# Patient Record
Sex: Female | Born: 1940 | Race: Black or African American | Hispanic: No | Marital: Single | Smoking: Former smoker
Health system: Southern US, Community
[De-identification: ages and names within clinical notes are randomized; demographics above are authoritative.]

## PROBLEM LIST (undated history)

## (undated) DIAGNOSIS — E785 Hyperlipidemia, unspecified: Secondary | ICD-10-CM

## (undated) DIAGNOSIS — K635 Polyp of colon: Secondary | ICD-10-CM

## (undated) DIAGNOSIS — K5790 Diverticulosis of intestine, part unspecified, without perforation or abscess without bleeding: Secondary | ICD-10-CM

## (undated) DIAGNOSIS — I10 Essential (primary) hypertension: Secondary | ICD-10-CM

## (undated) DIAGNOSIS — J8410 Pulmonary fibrosis, unspecified: Secondary | ICD-10-CM

## (undated) DIAGNOSIS — K219 Gastro-esophageal reflux disease without esophagitis: Secondary | ICD-10-CM

## (undated) DIAGNOSIS — B1920 Unspecified viral hepatitis C without hepatic coma: Secondary | ICD-10-CM

## (undated) HISTORY — DX: Gastro-esophageal reflux disease without esophagitis: K21.9

## (undated) HISTORY — PX: BREAST BIOPSY: SHX20

## (undated) HISTORY — DX: Diverticulosis of intestine, part unspecified, without perforation or abscess without bleeding: K57.90

## (undated) HISTORY — DX: Polyp of colon: K63.5

## (undated) HISTORY — PX: OTHER SURGICAL HISTORY: SHX169

## (undated) HISTORY — DX: Unspecified viral hepatitis C without hepatic coma: B19.20

## (undated) HISTORY — PX: ROTATOR CUFF REPAIR: SHX139

## (undated) HISTORY — PX: HEMICOLECTOMY: SHX854

## (undated) HISTORY — DX: Hyperlipidemia, unspecified: E78.5

## (undated) HISTORY — PX: ABDOMINAL HYSTERECTOMY: SHX81

## (undated) HISTORY — DX: Essential (primary) hypertension: I10

---

## 2001-02-22 ENCOUNTER — Encounter: Admission: RE | Admit: 2001-02-22 | Discharge: 2001-02-22 | Payer: Self-pay | Admitting: Family Medicine

## 2001-02-22 ENCOUNTER — Encounter: Payer: Self-pay | Admitting: Family Medicine

## 2001-03-14 ENCOUNTER — Encounter: Admission: RE | Admit: 2001-03-14 | Discharge: 2001-06-12 | Payer: Self-pay | Admitting: Family Medicine

## 2001-04-25 ENCOUNTER — Ambulatory Visit (HOSPITAL_COMMUNITY): Admission: RE | Admit: 2001-04-25 | Discharge: 2001-04-25 | Payer: Self-pay | Admitting: Gastroenterology

## 2001-05-31 ENCOUNTER — Emergency Department (HOSPITAL_COMMUNITY): Admission: EM | Admit: 2001-05-31 | Discharge: 2001-05-31 | Payer: Self-pay | Admitting: Emergency Medicine

## 2001-05-31 ENCOUNTER — Encounter: Payer: Self-pay | Admitting: Emergency Medicine

## 2001-10-04 ENCOUNTER — Encounter: Payer: Self-pay | Admitting: Family Medicine

## 2001-10-04 ENCOUNTER — Encounter: Admission: RE | Admit: 2001-10-04 | Discharge: 2001-10-04 | Payer: Self-pay | Admitting: Family Medicine

## 2002-02-26 ENCOUNTER — Encounter: Admission: RE | Admit: 2002-02-26 | Discharge: 2002-02-26 | Payer: Self-pay | Admitting: Family Medicine

## 2002-02-26 ENCOUNTER — Encounter: Payer: Self-pay | Admitting: Family Medicine

## 2002-08-09 ENCOUNTER — Encounter: Payer: Self-pay | Admitting: Family Medicine

## 2002-08-09 ENCOUNTER — Encounter: Admission: RE | Admit: 2002-08-09 | Discharge: 2002-08-09 | Payer: Self-pay | Admitting: Family Medicine

## 2002-09-05 HISTORY — PX: CHOLECYSTECTOMY: SHX55

## 2002-09-20 ENCOUNTER — Encounter: Admission: RE | Admit: 2002-09-20 | Discharge: 2002-09-20 | Payer: Self-pay | Admitting: Family Medicine

## 2002-09-20 ENCOUNTER — Encounter: Payer: Self-pay | Admitting: Family Medicine

## 2003-02-28 ENCOUNTER — Encounter: Admission: RE | Admit: 2003-02-28 | Discharge: 2003-02-28 | Payer: Self-pay | Admitting: Family Medicine

## 2003-02-28 ENCOUNTER — Encounter: Payer: Self-pay | Admitting: Family Medicine

## 2003-04-03 ENCOUNTER — Encounter: Payer: Self-pay | Admitting: Family Medicine

## 2003-04-03 ENCOUNTER — Encounter: Admission: RE | Admit: 2003-04-03 | Discharge: 2003-04-03 | Payer: Self-pay | Admitting: Family Medicine

## 2003-05-01 ENCOUNTER — Ambulatory Visit (HOSPITAL_COMMUNITY): Admission: RE | Admit: 2003-05-01 | Discharge: 2003-05-01 | Payer: Self-pay | Admitting: Family Medicine

## 2003-05-01 ENCOUNTER — Encounter: Payer: Self-pay | Admitting: Family Medicine

## 2003-06-04 ENCOUNTER — Ambulatory Visit (HOSPITAL_COMMUNITY): Admission: RE | Admit: 2003-06-04 | Discharge: 2003-06-04 | Payer: Self-pay | Admitting: Gastroenterology

## 2003-06-09 ENCOUNTER — Encounter: Payer: Self-pay | Admitting: Surgery

## 2003-06-13 ENCOUNTER — Encounter (INDEPENDENT_AMBULATORY_CARE_PROVIDER_SITE_OTHER): Payer: Self-pay | Admitting: Specialist

## 2003-06-14 ENCOUNTER — Encounter: Payer: Self-pay | Admitting: Surgery

## 2003-06-14 ENCOUNTER — Inpatient Hospital Stay (HOSPITAL_COMMUNITY): Admission: RE | Admit: 2003-06-14 | Discharge: 2003-06-16 | Payer: Self-pay | Admitting: Surgery

## 2003-12-05 ENCOUNTER — Encounter: Admission: RE | Admit: 2003-12-05 | Discharge: 2003-12-05 | Payer: Self-pay | Admitting: Family Medicine

## 2004-10-25 ENCOUNTER — Encounter: Admission: RE | Admit: 2004-10-25 | Discharge: 2004-10-25 | Payer: Self-pay | Admitting: Family Medicine

## 2005-03-31 ENCOUNTER — Ambulatory Visit (HOSPITAL_BASED_OUTPATIENT_CLINIC_OR_DEPARTMENT_OTHER): Admission: RE | Admit: 2005-03-31 | Discharge: 2005-03-31 | Payer: Self-pay | Admitting: General Surgery

## 2005-03-31 ENCOUNTER — Ambulatory Visit (HOSPITAL_COMMUNITY): Admission: RE | Admit: 2005-03-31 | Discharge: 2005-03-31 | Payer: Self-pay | Admitting: General Surgery

## 2005-03-31 ENCOUNTER — Encounter (INDEPENDENT_AMBULATORY_CARE_PROVIDER_SITE_OTHER): Payer: Self-pay | Admitting: *Deleted

## 2005-04-09 ENCOUNTER — Emergency Department (HOSPITAL_COMMUNITY): Admission: EM | Admit: 2005-04-09 | Discharge: 2005-04-10 | Payer: Self-pay | Admitting: Emergency Medicine

## 2005-07-19 ENCOUNTER — Encounter: Admission: RE | Admit: 2005-07-19 | Discharge: 2005-07-19 | Payer: Self-pay | Admitting: Family Medicine

## 2005-10-10 ENCOUNTER — Emergency Department (HOSPITAL_COMMUNITY): Admission: EM | Admit: 2005-10-10 | Discharge: 2005-10-10 | Payer: Self-pay | Admitting: Emergency Medicine

## 2005-10-31 ENCOUNTER — Encounter: Admission: RE | Admit: 2005-10-31 | Discharge: 2005-10-31 | Payer: Self-pay | Admitting: Family Medicine

## 2005-11-07 ENCOUNTER — Encounter: Admission: RE | Admit: 2005-11-07 | Discharge: 2005-11-07 | Payer: Self-pay | Admitting: Family Medicine

## 2006-02-03 ENCOUNTER — Ambulatory Visit: Payer: Self-pay | Admitting: Family Medicine

## 2006-03-20 ENCOUNTER — Ambulatory Visit: Payer: Self-pay | Admitting: Family Medicine

## 2006-09-14 ENCOUNTER — Ambulatory Visit: Payer: Self-pay | Admitting: Family Medicine

## 2006-10-16 ENCOUNTER — Ambulatory Visit: Payer: Self-pay | Admitting: Family Medicine

## 2006-10-17 ENCOUNTER — Encounter: Admission: RE | Admit: 2006-10-17 | Discharge: 2006-10-17 | Payer: Self-pay | Admitting: Physician Assistant

## 2006-10-22 ENCOUNTER — Emergency Department (HOSPITAL_COMMUNITY): Admission: EM | Admit: 2006-10-22 | Discharge: 2006-10-22 | Payer: Self-pay | Admitting: Emergency Medicine

## 2006-10-31 ENCOUNTER — Ambulatory Visit: Payer: Self-pay | Admitting: Family Medicine

## 2006-11-15 ENCOUNTER — Ambulatory Visit: Payer: Self-pay | Admitting: Family Medicine

## 2006-11-24 ENCOUNTER — Encounter: Admission: RE | Admit: 2006-11-24 | Discharge: 2006-11-24 | Payer: Self-pay | Admitting: Internal Medicine

## 2007-01-04 ENCOUNTER — Ambulatory Visit: Payer: Self-pay | Admitting: Family Medicine

## 2007-03-16 ENCOUNTER — Emergency Department (HOSPITAL_COMMUNITY): Admission: EM | Admit: 2007-03-16 | Discharge: 2007-03-16 | Payer: Self-pay | Admitting: Emergency Medicine

## 2007-05-03 ENCOUNTER — Ambulatory Visit: Payer: Self-pay | Admitting: Family Medicine

## 2007-07-18 ENCOUNTER — Ambulatory Visit: Payer: Self-pay | Admitting: Family Medicine

## 2007-07-19 ENCOUNTER — Encounter: Admission: RE | Admit: 2007-07-19 | Discharge: 2007-07-19 | Payer: Self-pay | Admitting: Family Medicine

## 2007-10-02 ENCOUNTER — Ambulatory Visit: Payer: Self-pay | Admitting: Family Medicine

## 2007-11-26 ENCOUNTER — Encounter: Admission: RE | Admit: 2007-11-26 | Discharge: 2007-11-26 | Payer: Self-pay | Admitting: Internal Medicine

## 2007-11-28 ENCOUNTER — Ambulatory Visit: Payer: Self-pay | Admitting: Family Medicine

## 2007-12-05 ENCOUNTER — Encounter: Admission: RE | Admit: 2007-12-05 | Discharge: 2007-12-05 | Payer: Self-pay | Admitting: Family Medicine

## 2008-02-11 ENCOUNTER — Ambulatory Visit: Payer: Self-pay | Admitting: Family Medicine

## 2008-06-25 ENCOUNTER — Ambulatory Visit: Payer: Self-pay | Admitting: Family Medicine

## 2008-07-14 ENCOUNTER — Ambulatory Visit: Payer: Self-pay | Admitting: Family Medicine

## 2008-10-06 ENCOUNTER — Ambulatory Visit: Payer: Self-pay | Admitting: Family Medicine

## 2008-12-01 ENCOUNTER — Encounter: Admission: RE | Admit: 2008-12-01 | Discharge: 2008-12-01 | Payer: Self-pay | Admitting: Internal Medicine

## 2008-12-22 ENCOUNTER — Ambulatory Visit: Payer: Self-pay | Admitting: Family Medicine

## 2008-12-23 ENCOUNTER — Encounter: Admission: RE | Admit: 2008-12-23 | Discharge: 2008-12-23 | Payer: Self-pay | Admitting: Family Medicine

## 2009-01-12 ENCOUNTER — Ambulatory Visit: Payer: Self-pay | Admitting: Family Medicine

## 2009-01-13 ENCOUNTER — Encounter: Admission: RE | Admit: 2009-01-13 | Discharge: 2009-01-13 | Payer: Self-pay | Admitting: Family Medicine

## 2009-04-13 ENCOUNTER — Ambulatory Visit: Payer: Self-pay | Admitting: Family Medicine

## 2009-06-11 ENCOUNTER — Emergency Department (HOSPITAL_COMMUNITY): Admission: EM | Admit: 2009-06-11 | Discharge: 2009-06-11 | Payer: Self-pay | Admitting: Emergency Medicine

## 2009-09-16 ENCOUNTER — Ambulatory Visit: Payer: Self-pay | Admitting: Family Medicine

## 2009-12-02 ENCOUNTER — Encounter: Admission: RE | Admit: 2009-12-02 | Discharge: 2009-12-02 | Payer: Self-pay | Admitting: Family Medicine

## 2009-12-02 LAB — HM MAMMOGRAPHY

## 2009-12-21 ENCOUNTER — Ambulatory Visit: Payer: Self-pay | Admitting: Physician Assistant

## 2010-04-30 ENCOUNTER — Ambulatory Visit: Payer: Self-pay | Admitting: Family Medicine

## 2010-07-19 ENCOUNTER — Ambulatory Visit: Payer: Self-pay | Admitting: Family Medicine

## 2010-07-19 ENCOUNTER — Encounter: Admission: RE | Admit: 2010-07-19 | Discharge: 2010-07-19 | Payer: Self-pay | Admitting: Family Medicine

## 2010-07-20 ENCOUNTER — Encounter: Admission: RE | Admit: 2010-07-20 | Discharge: 2010-07-20 | Payer: Self-pay | Admitting: Family Medicine

## 2010-09-01 ENCOUNTER — Ambulatory Visit: Payer: Self-pay | Admitting: Family Medicine

## 2010-09-13 ENCOUNTER — Ambulatory Visit
Admission: RE | Admit: 2010-09-13 | Discharge: 2010-09-13 | Payer: Self-pay | Source: Home / Self Care | Attending: Family Medicine | Admitting: Family Medicine

## 2010-09-14 ENCOUNTER — Encounter
Admission: RE | Admit: 2010-09-14 | Discharge: 2010-09-14 | Payer: Self-pay | Source: Home / Self Care | Attending: Family Medicine | Admitting: Family Medicine

## 2010-09-17 ENCOUNTER — Encounter
Admission: RE | Admit: 2010-09-17 | Discharge: 2010-09-17 | Payer: Self-pay | Source: Home / Self Care | Attending: Family Medicine | Admitting: Family Medicine

## 2010-09-23 ENCOUNTER — Encounter
Admission: RE | Admit: 2010-09-23 | Discharge: 2010-09-23 | Payer: Self-pay | Source: Home / Self Care | Attending: Orthopaedic Surgery | Admitting: Orthopaedic Surgery

## 2010-11-01 ENCOUNTER — Other Ambulatory Visit: Payer: Self-pay | Admitting: Psychiatry

## 2010-11-01 DIAGNOSIS — Z1231 Encounter for screening mammogram for malignant neoplasm of breast: Secondary | ICD-10-CM

## 2010-11-15 ENCOUNTER — Ambulatory Visit (INDEPENDENT_AMBULATORY_CARE_PROVIDER_SITE_OTHER): Payer: Medicare Other | Admitting: Family Medicine

## 2010-11-15 DIAGNOSIS — J029 Acute pharyngitis, unspecified: Secondary | ICD-10-CM

## 2010-11-15 DIAGNOSIS — J069 Acute upper respiratory infection, unspecified: Secondary | ICD-10-CM

## 2010-11-24 ENCOUNTER — Ambulatory Visit (INDEPENDENT_AMBULATORY_CARE_PROVIDER_SITE_OTHER): Payer: Medicare Other

## 2010-11-24 DIAGNOSIS — I10 Essential (primary) hypertension: Secondary | ICD-10-CM

## 2010-11-24 DIAGNOSIS — E7800 Pure hypercholesterolemia, unspecified: Secondary | ICD-10-CM

## 2010-11-24 DIAGNOSIS — E119 Type 2 diabetes mellitus without complications: Secondary | ICD-10-CM

## 2010-11-24 DIAGNOSIS — Z79899 Other long term (current) drug therapy: Secondary | ICD-10-CM

## 2010-11-29 ENCOUNTER — Ambulatory Visit (INDEPENDENT_AMBULATORY_CARE_PROVIDER_SITE_OTHER): Payer: Medicare Other | Admitting: Family Medicine

## 2010-11-29 DIAGNOSIS — E7800 Pure hypercholesterolemia, unspecified: Secondary | ICD-10-CM

## 2010-11-29 DIAGNOSIS — E119 Type 2 diabetes mellitus without complications: Secondary | ICD-10-CM

## 2010-11-29 DIAGNOSIS — I10 Essential (primary) hypertension: Secondary | ICD-10-CM

## 2010-12-06 ENCOUNTER — Ambulatory Visit
Admission: RE | Admit: 2010-12-06 | Discharge: 2010-12-06 | Disposition: A | Discharge status: Home / Self Care | Payer: Federal, State, Local not specified - PPO | Source: Ambulatory Visit | Attending: Psychiatry | Admitting: Psychiatry

## 2010-12-06 DIAGNOSIS — Z1231 Encounter for screening mammogram for malignant neoplasm of breast: Secondary | ICD-10-CM

## 2010-12-09 LAB — COMPREHENSIVE METABOLIC PANEL
ALT: 35 U/L (ref 0–35)
AST: 43 U/L — ABNORMAL HIGH (ref 0–37)
Creatinine, Ser: 0.88 mg/dL (ref 0.4–1.2)
GFR calc non Af Amer: >60 mL/min (ref >60)
Potassium: 3.7 mEq/L (ref 3.5–5.1)
Sodium: 140 mEq/L (ref 135–145)
Total Bilirubin: 0.7 mg/dL (ref 0.3–1.2)

## 2010-12-09 LAB — CBC
Hemoglobin: 12.2 g/dL (ref 12.0–15.0)
MCHC: 34.2 g/dL (ref 30.0–36.0)
Platelets: 170 10*3/uL (ref 150–400)
RDW: 13.7 % (ref 11.5–15.5)

## 2010-12-09 LAB — POCT CARDIAC MARKERS: Troponin i, poc: <0.05 ng/mL (ref 0.00–0.09)

## 2010-12-09 LAB — DIFFERENTIAL
Basophils Absolute: 0 10*3/uL (ref 0.0–0.1)
Basophils Relative: 0 % (ref 0–1)
Eosinophils Absolute: 0.2 10*3/uL (ref 0.0–0.7)
Eosinophils Relative: 4 % (ref 0–5)
Lymphocytes Relative: 32 % (ref 12–46)

## 2010-12-09 LAB — BRAIN NATRIURETIC PEPTIDE: Pro B Natriuretic peptide (BNP): <30 pg/mL (ref 0.0–100.0)

## 2011-01-21 NOTE — Op Note (Signed)
NAMEJEARLINE, Margaret Adkins NO.:  [id number]   MEDICAL RECORD NO.:  [id number]          PATIENT TYPE:  AMB   LOCATION:  DSC                          FACILITY:  [facility]   PHYSICIAN:  Gabrielle Dare. Janee Morn, M.D.DATE OF BIRTH:  01-08-1941   DATE OF PROCEDURE:  03/31/2005  DATE OF DISCHARGE:                                 OPERATIVE REPORT   PREOPERATIVE DIAGNOSIS:  Soft tissue mass, left back.   POSTOPERATIVE DIAGNOSIS:  Same.   PROCEDURE PERFORMED:  Excision of soft tissue mass of left back.   SURGEON:  Gabrielle Dare. Janee Morn, M.D.   ANESTHESIA:  MAC.   HISTORY OF PRESENT ILLNESS:  The patient is a 70 year old African-American  female whom I evaluated in the office for a soft tissue mass on her left  back.  She has had multiple lipomas removed from her back on both sides in  the lateral area.  She developed another one that formed between two of her  previous biopsy sites and has become larger and more tender.  She has some  localized discomfort there.  She presents for elective excision.   DESCRIPTION OF PROCEDURE:  The patient was identified.  The mass to be  removed was marked in preop holding.  She received intravenous antibiotics.  She was brought to the operating room.  MAC anesthesia was administered.  She was placed in the prone position.  Her left back was prepped and draped  in sterile fashion.   0.25% marcaine with epi was injected for local anesthetic.  A transverse  incision was made over the palpable mass.  The subcutaneous tissues were  dissected down, and the mass was retracted and circumferentially dissected.  It was somewhat adherent deeply to the musculature, and some fascia was  removed with it.  Some further local was injected.  The dissection continued  circumferentially, and the entire mass of lipomatous tissue was removed.  After removal of the first larger piece, palpation revealed a smaller  extension or second lesion laterally.  This was also  excised in a similar  fashion.  The wound was copiously irrigated.   Meticulous hemostasis was obtained in the wound.  Subcutaneous tissues were  approximated with interrupted 3-0 Vicryl sutures, and the skin was closed  with running 4-0 Monocryl subcuticular stitch.  Sponge, needle, and  instrument counts were correct.  Benzoin, Steri-Strips, and sterile dressing  were applied.  The patient tolerated the procedure well without apparent  complication and was taken to the recovery room in stable condition.       BET/MEDQ  D:  03/31/2005  T:  03/31/2005  Job:  [id number]

## 2011-01-21 NOTE — Op Note (Signed)
NAMEDARA, BEIDLEMAN NO.:  [id number]   MEDICAL RECORD NO.:  [id number]                   PATIENT TYPE:  OBV   LOCATION:  0464                                 FACILITY:  [facility]   PHYSICIAN:  Abigail Miyamoto, M.D.              DATE OF BIRTH:  01/09/1941   DATE OF PROCEDURE:  06/13/2003  DATE OF DISCHARGE:                                 OPERATIVE REPORT   PREOPERATIVE DIAGNOSES:  1. Biliary dyskinesia.  2. A 1 cm mass, right foot.   POSTOPERATIVE DIAGNOSES:  1. Biliary dyskinesia.  2. A 1 cm mass, right foot.   OPERATION/PROCEDURE:  1. Laparoscopic cholecystectomy.  2. Excision of 1 cm mass, right foot.   SURGEON:  Abigail Miyamoto, M.D.   ANESTHESIA:  General endotracheal anesthesia.   ESTIMATED BLOOD LOSS:  Minimal.   DESCRIPTION OF PROCEDURE:  The patient was brought to the operating room,  identified as Margaret Adkins.  She was placed supine on the operating room  table and general anesthesia was induced.  Her abdomen was then prepped and  draped in the usual sterile fashion.  Using #15 blade, a small transverse  incision was made below the umbilicus.  The incision was carried down to the  fascia which was then opened with a scalpel.  Hemostat was then used to pass  through the peritoneal cavity.  Next, a 0 Vicryl pursestring was placed  around the fascial opening.  The Hasson port was placed through the opening  and insufflation of the abdomen was begun.  Next, a 12 mm port was placed in  the patient's epigastrium and two 5 mm ports were placed in the patient's  right flank under direct vision.  The patient had multiple adhesions to the  gallbladder and the gallbladder appeared chronically inflamed.  Adhesions  were taken down from the gallbladder bluntly and with the electrocautery.  The cystic duct was then identified as well as the cystic artery.  The duct  was clipped once distally and partly transfixed with scissors.  It,  however,  was quite friable.  The decision was made to forego a cholangiogram for risk  of tearing the duct further.  The duct, therefore, was clipped three times  proximally and completely transected.  Cystic artery was clipped proximally  and once distally and transected as well.  The gallbladder was then slowly  dissected free from the liver bed with the electrocautery.  Once the  gallbladder was free from the liver bed,  it was placed in an endosac and  removed through the incision at the umbilicus. The liver bed was examined  and hemostasis was felt to be achieved.  The abdomen was then irrigated with  normal saline.  The 0 Vicryl was tied in place closing the fascial defect at  the umbilicus.  All ports were removed under direct vision and the abdomen  was deflated.  All incisions  were then anesthetized with 0.25% Marcaine and  then closed with 4-0 Monocryl subcuticular sutures.  Steri-Strips, gauze and  tape were then applied.   The patient's right foot was then prepped and draped in the usual sterile  fashion.  The patient was found to have a small 1 cm mass on the plantar  aspect of the foot near the arch medially.  A small longitudinal incision  was made over this and it was taken down to the mass which was completely  excised.  It had the appearance of scar tissue or a ganglion.  It was sent  to pathology for identification.  The wound was then closed with two  separate 2-0 nylon sutures.  Gauze and tape were then applied.   The patient tolerated the procedure well.  All sponge, needle and instrument  counts were correct at the end of the procedure.  The patient was then  extubated in the operating room and taken in stable condition to the  recovery room.                                                 Abigail Miyamoto, M.D.    DB/MEDQ  D:  06/13/2003  T:  06/13/2003  Job:  [id number]

## 2011-01-21 NOTE — Op Note (Signed)
   NAME:  Margaret Adkins, Margaret Adkins                         ACCOUNT NO.:  [id number]   MEDICAL RECORD NO.:  [id number]                   PATIENT TYPE:  AMB   LOCATION:  ENDO                                 FACILITY:  [facility]   PHYSICIAN:  Anselmo Rod, M.D.               DATE OF BIRTH:  23-Aug-1941   DATE OF PROCEDURE:  06/05/2003  DATE OF DISCHARGE:                                 OPERATIVE REPORT   PROCEDURE PERFORMED:  Esophagogastroduodenoscopy.   ENDOSCOPIST:  Charna Elizabeth, M.D.   INSTRUMENT USED:  Olympus video panendoscope.   INDICATIONS FOR PROCEDURE:  Epigastric pain and history of dysfunctional  gallbladder in a 70 year old [place] American female with a history of  hepatitis C.  Rule out peptic ulcer disease, esophagitis, gastritis, etc.   PREPROCEDURE PREPARATION:  Informed consent was procured from the patient.  The patient was fasted for eight hours prior to the procedure.   PREPROCEDURE PHYSICAL EXAMINATION:  VITAL SIGNS:  The patient has stable  vital signs.  NECK:  Supple.  CHEST:  Clear to auscultation.  HEART:  S1 and S2 regular.  ABDOMEN:  Soft with normal bowel sounds.   DESCRIPTION OF PROCEDURE:  The patient was placed in the left lateral  decubitus position and sedated with 80 mg of Demerol and 9 mg of Versed  intravenously.  Once the patient was adequately sedated and maintained on  low flow oxygen and continuous cardiac monitoring, the Olympus video  panendoscope was advanced through the mouthpiece, over the tongue, into the  esophagus under direct vision.  The entire esophagus appeared normal with no  evidence of ring, stricture, masses, esophagitis or Barrett's mucosa.  The  scope was then advanced to the stomach.  A small hiatal hernia was seen on  high retroflexion.  The rest of the gastric mucosa and proximal small bowel  appeared normal.   IMPRESSION:  Normal esophagogastroduodenoscopy except for small hiatal  hernia.    RECOMMENDATIONS:  1. Avoid  nonsterioidals.  2. Follow antireflux measures.  3. Proceed with a laparoscopic cholecystectomy as discussed with Dr.     Magnus Ivan over the phone.                                               Anselmo Rod, M.D.    JNM/MEDQ  D:  06/05/2003  T:  06/05/2003  Job:  [id number]   cc:   Talmadge Coventry, M.D.  [address], [address]  [place]  [place] [address]  Fax: [phone]   Abigail Miyamoto, M.D.  [address]  [place]  [place] [address]  Fax: [phone]

## 2011-01-28 ENCOUNTER — Telehealth (INDEPENDENT_AMBULATORY_CARE_PROVIDER_SITE_OTHER): Payer: Federal, State, Local not specified - PPO | Admitting: Family Medicine

## 2011-01-28 DIAGNOSIS — I10 Essential (primary) hypertension: Secondary | ICD-10-CM

## 2011-01-28 MED ORDER — OLMESARTAN MEDOXOMIL-HCTZ 40-25 MG PO TABS: 1.0000 | ORAL_TABLET | Freq: Every day | ORAL | Status: DC

## 2011-02-03 ENCOUNTER — Ambulatory Visit (INDEPENDENT_AMBULATORY_CARE_PROVIDER_SITE_OTHER): Payer: Medicare Other | Admitting: Medical

## 2011-02-03 ENCOUNTER — Encounter: Payer: Self-pay | Admitting: Medical

## 2011-02-03 VITALS — BP 130/72 | HR 64 | Temp 97.6°F | Ht 63.0 in | Wt 162.0 lb

## 2011-02-03 DIAGNOSIS — M25511 Pain in right shoulder: Secondary | ICD-10-CM

## 2011-02-03 DIAGNOSIS — M25519 Pain in unspecified shoulder: Secondary | ICD-10-CM

## 2011-02-03 DIAGNOSIS — M542 Cervicalgia: Secondary | ICD-10-CM

## 2011-02-03 MED ORDER — CYCLOBENZAPRINE HCL 5 MG PO TABS: 5.0000 mg | ORAL_TABLET | Freq: Every evening | ORAL | Status: AC | PRN

## 2011-02-03 MED ORDER — TRAMADOL HCL 50 MG PO TABS: 50.0000 mg | ORAL_TABLET | Freq: Four times a day (QID) | ORAL | Status: DC | PRN

## 2011-02-03 NOTE — Progress Notes (Signed)
Subjective:   HPI  Margaret Adkins is a 70 y.o. female who presents for pain in her right shoulder region for 2- 3 months.  She denies injury or trauma.  The area just starting aching out of the blue and is continued to be a problem since November of 2011.  She notes that she has been seen multiple times by [place] and orthopedic for this problem, had an injection into her right [place] joint which helped a little for a short period of time. But now she continues to complain of pain, decreased range of motion of her right shoulder, neck pain, swelling at the [facility] joint on the right, and she feels as if the [facility] joint area and is growing or getting bigger. She uses 2 Aleve twice a day, no other aggravating or relieving factors.  No other c/o.  The following portions of the patient's history were reviewed and updated as appropriate: allergies, current medications, past family history, past medical history, past social history, past surgical history and problem list.   Review of Systems Constitutional: denies fever, chills, sweats, unexpected weight change, anorexia, fatigue Dermatology: denies rash ENT: no runny nose, ear pain, sore throat, hoarseness, sinus pain Cardiology: denies chest pain, palpitations, edema Respiratory: Occasional cough; denies shortness of breath, wheezing Gastroenterology: denies abdominal pain, nausea, vomiting, diarrhea, constipation Musculoskeletal: denies arthralgias, myalgias, joint swelling, back pain Neurology: no headache, weakness, tingling, numbness     Objective:   Physical Exam  General appearance: alert, no distress, WD/WN, African American female Neck: Tender along right neck in general, normal range of motion, no lymphadenopathy, no thyromegaly, no masses Heart: RRR, normal S1, S2, no murmurs Lungs: CTA bilaterally, no wheezes, rhonchi, or rales Back: Tender along right upper back in general, otherwise nontender normal range of motion  Musculoskeletal: Tender along  her right [place] joint with some generalized swelling of this area, the [place] joint with bony arthritic changes certainly asymmetrical compared to the left, tender along the right clavicle, tender along the right a.c. joint, pain with passive and active shoulder range of motion beyond 70 with flexion, abduction and adduction, otherwise, the rest of the right arm exam unremarkable, left arm unremarkable Extremities: no edema, no cyanosis, no clubbing Pulses: 2+ symmetric, upper and lower extremities Neurological: Bilateral upper extremity arms with normal strength and sensation Psychiatric: normal affect, behavior normal, pleasant    Assessment :    Encounter Diagnoses  Name Primary?  . Sternoclavicular joint pain Yes  . Neck pain   . Shoulder pain, right      Plan:    I reviewed her prior office notes and imaging including MRI chest from 09/23/10 showing right sternoclavicular arthropathy with underlying degenerative findings and edema in the medial clavicle, and adjacent sternum, and sternoclavicular joint. MRI C-spine: 09/17/10 with cervical spondylosis most prominent at C6-7, foraminal encroachment by osteophytes at C5-6 and C6-7 without suspicion of compressive stenosis. I reviewed Dr. Eliberto Ivory orthopedic note from 10/04/10 with an assessment of right-sided severe sternoclavicular arthritis, and at that time she was to continue anti-inflammatories. At some point she ended up having a steroid injection of that area.  At this point I prescribed her something for pain and a muscle relaxer, and she can continue to use Aleve twice a day for now. She will call Dr. Magnus Ivan for a recheck appointment on this issue. She needs further evaluation and management per orthopedic.

## 2011-02-03 NOTE — Patient Instructions (Signed)
She will followup Dr. Magnus Ivan, orthopedic.

## 2011-02-21 NOTE — Telephone Encounter (Signed)
DONE

## 2011-03-18 ENCOUNTER — Other Ambulatory Visit: Payer: Self-pay | Admitting: Family Medicine

## 2011-03-18 NOTE — Telephone Encounter (Signed)
CALLED PT TO LET HER NO MED SAMPLES WERE READY

## 2011-03-18 NOTE — Telephone Encounter (Signed)
Pt would like samples of actoplusmet 15/500 and Benicar 40/25

## 2011-03-18 NOTE — Telephone Encounter (Signed)
Called left message samples up front

## 2011-03-18 NOTE — Telephone Encounter (Signed)
If we have them, she can have a few.

## 2011-06-21 LAB — I-STAT 8, (EC8 V) (CONVERTED LAB)
BUN: 20
Chloride: 104
HCT: 41
Potassium: 4.1
pCO2, Ven: 44.7 — ABNORMAL LOW
pH, Ven: 7.396 — ABNORMAL HIGH

## 2011-06-21 LAB — CBC
HCT: 36.3
Hemoglobin: 12
RBC: 3.99
WBC: 7.4

## 2011-06-21 LAB — POCT I-STAT CREATININE
Creatinine, Ser: 0.9
Operator id: 198171

## 2011-06-21 LAB — DIFFERENTIAL
Eosinophils Relative: 1
Lymphs Abs: 2.8
Monocytes Relative: 7
Neutro Abs: 3.9

## 2011-09-19 ENCOUNTER — Ambulatory Visit
Admission: RE | Admit: 2011-09-19 | Discharge: 2011-09-19 | Disposition: A | Discharge status: Home / Self Care | Payer: Medicare Other | Source: Ambulatory Visit | Attending: Family Medicine | Admitting: Family Medicine

## 2011-09-19 ENCOUNTER — Encounter: Payer: Self-pay | Admitting: Family Medicine

## 2011-09-19 ENCOUNTER — Encounter: Payer: Self-pay | Admitting: *Deleted

## 2011-09-19 ENCOUNTER — Ambulatory Visit (INDEPENDENT_AMBULATORY_CARE_PROVIDER_SITE_OTHER): Payer: Medicare Other | Admitting: Family Medicine

## 2011-09-19 DIAGNOSIS — R52 Pain, unspecified: Secondary | ICD-10-CM

## 2011-09-19 DIAGNOSIS — J209 Acute bronchitis, unspecified: Secondary | ICD-10-CM

## 2011-09-19 DIAGNOSIS — E119 Type 2 diabetes mellitus without complications: Secondary | ICD-10-CM

## 2011-09-19 DIAGNOSIS — I10 Essential (primary) hypertension: Secondary | ICD-10-CM | POA: Insufficient documentation

## 2011-09-19 DIAGNOSIS — E785 Hyperlipidemia, unspecified: Secondary | ICD-10-CM | POA: Insufficient documentation

## 2011-09-19 DIAGNOSIS — R05 Cough: Secondary | ICD-10-CM

## 2011-09-19 DIAGNOSIS — R079 Chest pain, unspecified: Secondary | ICD-10-CM

## 2011-09-19 DIAGNOSIS — E7800 Pure hypercholesterolemia, unspecified: Secondary | ICD-10-CM

## 2011-09-19 LAB — POCT INFLUENZA A/B: Influenza B, POC: NEGATIVE

## 2011-09-19 MED ORDER — AZITHROMYCIN 250 MG PO TABS: ORAL_TABLET | ORAL | Status: AC

## 2011-09-19 MED ORDER — ALBUTEROL SULFATE HFA 108 (90 BASE) MCG/ACT IN AERS: 2.0000 | INHALATION_SPRAY | Freq: Four times a day (QID) | RESPIRATORY_TRACT | Status: DC | PRN

## 2011-09-19 MED ORDER — ALBUTEROL SULFATE (5 MG/ML) 0.5% IN NEBU: 2.5000 mg | INHALATION_SOLUTION | Freq: Once | RESPIRATORY_TRACT | Status: DC

## 2011-09-19 MED ORDER — PIOGLITAZONE HCL-METFORMIN HCL 15-500 MG PO TABS: 1.0000 | ORAL_TABLET | Freq: Two times a day (BID) | ORAL | Status: DC

## 2011-09-19 MED ORDER — AEROCHAMBER MV MISC: Status: AC

## 2011-09-19 MED ORDER — HYDROCODONE-HOMATROPINE 5-1.5 MG/5ML PO SYRP: 5.0000 mL | ORAL_SOLUTION | Freq: Three times a day (TID) | ORAL | Status: AC | PRN

## 2011-09-19 NOTE — Progress Notes (Signed)
Chief complaint:  Cough since Friday she has had cough and congestion. "Burning up" Monday night. As well as body aches  HPI:  Took bus back from [place] along with a busload of sick people.  While on the bus, 3 days ago, she began coughing, sneezing, headache, body aches, and felt weak, and hot.  Couldn't find thermometer, but felt very hot, alternating with cold.  Denies chills.  Has been taking Vicks cough syrup, and Mucinex cough syrup (containing DM and phenylephrine, as well as guaifenesin), which helped some.  Overall, she is feeling a little better than she did initially, but still coughing and sneezing.  Trouble sleeping due to cough.  Nasal drainage is discolored, creamy.  Cough is mainly dry, occasionally getting up thick clear phlegm.  She was last seen here 11/2010, due to f/u in September, but has been out of town traveling since July.  Also complaining of swelling and pain in her R clavicle since July. Hurts worse when she lies down on her right side.  Denies any fall, trauma, injury. She states that she has seen Dr. Magnus Ivan (her ortho) for this in past, was given cortisone shot, but didn't help.  Never had x-ray done.  Has appt with him tomorrow.  Past Medical History  Diagnosis Date  . Colon polyps   . Hepatitis C   . Diverticulosis   . Diabetes mellitus   . Hypertension   . GERD (gastroesophageal reflux disease)   . Osteoporosis   . Hyperlipidemia     Past Surgical History  Procedure Date  . Cholecystectomy 2004  . Rotator cuff repair     right  . Abdominal hysterectomy     and LSO in 1980's  . Lipoma removal     abdomen  . Breast biopsy     left, benign  . Hemicolectomy     left, for diverticulitis (in [place])    History   Social History  . Marital Status: Single    Spouse Name: N/A    Number of Children: N/A  . Years of Education: N/A   Occupational History  . Not on file.   Social History Main Topics  . Smoking status: Former Smoker    Quit date: 09/05/1958   . Smokeless tobacco: Never Used  . Alcohol Use: No  . Drug Use: No  . Sexually Active: Not on file   Other Topics Concern  . Not on file   Social History Narrative  . No narrative on file   Current outpatient prescriptions:aspirin 81 MG EC tablet, Take 81 mg by mouth daily.  , Disp: , Rfl: ;  olmesartan-hydrochlorothiazide (BENICAR HCT) 40-25 MG per tablet, Take 1 tablet by mouth daily., Disp: 30 tablet, Rfl: 2;  Phenylephrine-DM-GG (MUCINEX FAST-MAX CONGEST COUGH) 2.5-5-100 MG/5ML LIQD, Take 30 mLs by mouth daily., Disp: , Rfl:  pioglitazone-metformin (ACTOPLUS MET) 15-500 MG per tablet, Take 1 tablet by mouth 2 (two) times daily with a meal.  , Disp: , Rfl: ;  pravastatin (PRAVACHOL) 40 MG tablet, Take 40 mg by mouth daily.  , Disp: , Rfl: ;  travoprost, benzalkonium, (TRAVATAN) 0.004 % ophthalmic solution, Place 1 drop into both eyes at bedtime.  , Disp: , Rfl:  traMADol (ULTRAM) 50 MG tablet, Take 1 tablet (50 mg total) by mouth every 6 (six) hours as needed for pain., Disp: 20 tablet, Rfl: 0  Allergies  Allergen Reactions  . Codeine Rash   ROS:  Denies skin rash, dysphagia, nausea, vomiting or diarrhea (  had diarrhea last week, resolved).  Denies chest pain, shortness of breath, edema, or other concerns.  Nosebleed 2 nights ago. Has been taking Aleve 2 twice daily during illness.  PHYSICAL EXAM: BP 100/64  Pulse 72  Temp(Src) 98.6 F (37 C) (Oral)  Ht 5\' 3"  (1.6 m)  Wt 166 lb (75.297 kg)  BMI 29.41 kg/m2 Well developed, elderly female, with frequent dry cough, in no acute distress.  Speaking comfortably in full sentences HEENT:  PERRL, EOMI ,conjunctiva clear.  TM's and EAC's normal.  OP clear, moist mucus membranes.  Sinuses nontender. Neck: no lymphadenopathy or thyromegaly. Heart: regular rate and rhythm without murmur Tender at R clavicle, with some soft tissue swelling along medial clavicle and R [place] joint. Lungs: Wheezes R>L throughout.  After nebulizer, much less  coughing, improved air movement.  Lungs clear posteriorly, but wheezing with ronchi anteriorly.  ASSESSMENT/PLAN: 1. Body aches  POCT Influenza A/B, DISCONTINUED: albuterol (PROVENTIL) (5 MG/ML) 0.5% nebulizer solution 2.5 mg  2. Cough  POCT Influenza A/B, HYDROcodone-homatropine (HYCODAN) 5-1.5 MG/5ML syrup, CBC with Differential, albuterol (PROVENTIL) (5 MG/ML) 0.5% nebulizer solution 2.5 mg, DISCONTINUED: albuterol (PROVENTIL) (5 MG/ML) 0.5% nebulizer solution 2.5 mg  3. Acute bronchitis  azithromycin (ZITHROMAX) 250 MG tablet, albuterol (PROVENTIL HFA;VENTOLIN HFA) 108 (90 BASE) MCG/ACT inhaler, Spacer/Aero-Holding Chambers (AEROCHAMBER MV) inhaler, albuterol (PROVENTIL) (5 MG/ML) 0.5% nebulizer solution 2.5 mg, DISCONTINUED: albuterol (PROVENTIL) (5 MG/ML) 0.5% nebulizer solution 2.5 mg  4. Type II or unspecified type diabetes mellitus without mention of complication, not stated as uncontrolled  pioglitazone-metformin (ACTOPLUS MET) 15-500 MG per tablet, Comprehensive metabolic panel, Hemoglobin A1c, TSH  5. Essential hypertension, benign  Comprehensive metabolic panel  6. Pure hypercholesterolemia  Lipid panel  7. Chest pain  DG Clavicle Right, albuterol (PROVENTIL) (5 MG/ML) 0.5% nebulizer solution 2.5 mg, DISCONTINUED: albuterol (PROVENTIL) (5 MG/ML) 0.5% nebulizer solution 2.5 mg   Flu test negative Asthmatic bronchitis--treat with z-pak and albuterol MDI prn.  Hycodan prn Instructed on proper use of inhaler, recommended use of aerochamber since she has never used one before.  R  and clavilcular pain.  I suspect degenerative/arthritic changes. Check x-rays today. F/u with ortho tomorrow, as scheduled.  DM, HTN, hyperlipidemia--past due for labs and follow up.  She is fasting today, so labs are being done today.  F/u next week on illness and for med check  Urine microalbumin will be due in March No longer seeing liver doctor or on treatment for Hepatitis C

## 2011-09-20 LAB — COMPREHENSIVE METABOLIC PANEL
Alkaline Phosphatase: 68 U/L (ref 39–117)
Creat: 1.02 mg/dL (ref 0.50–1.10)
Glucose, Bld: 100 mg/dL — ABNORMAL HIGH (ref 70–99)
Sodium: 140 mEq/L (ref 135–145)
Total Bilirubin: 0.5 mg/dL (ref 0.3–1.2)
Total Protein: 7.5 g/dL (ref 6.0–8.3)

## 2011-09-20 LAB — CBC WITH DIFFERENTIAL/PLATELET
Basophils Absolute: 0 10*3/uL (ref 0.0–0.1)
Basophils Relative: 0 % (ref 0–1)
Eosinophils Absolute: 0.1 10*3/uL (ref 0.0–0.7)
Hemoglobin: 12.5 g/dL (ref 12.0–15.0)
MCH: 28.9 pg (ref 26.0–34.0)
MCHC: 30.9 g/dL (ref 30.0–36.0)
Monocytes Absolute: 0.6 10*3/uL (ref 0.1–1.0)
Monocytes Relative: 10 % (ref 3–12)
Neutro Abs: 2.8 10*3/uL (ref 1.7–7.7)
Neutrophils Relative %: 46 % (ref 43–77)
RDW: 13.2 % (ref 11.5–15.5)

## 2011-09-20 LAB — LIPID PANEL
LDL Cholesterol: 73 mg/dL (ref 0–99)
Total CHOL/HDL Ratio: 2.6 Ratio
VLDL: 17 mg/dL (ref 0–40)

## 2011-09-20 LAB — TSH: TSH: 0.653 u[IU]/mL (ref 0.350–4.500)

## 2011-09-20 LAB — HEMOGLOBIN A1C
Hgb A1c MFr Bld: 6.2 % — ABNORMAL HIGH (ref <5.7)
Mean Plasma Glucose: 131 mg/dL — ABNORMAL HIGH (ref <117)

## 2011-10-03 ENCOUNTER — Ambulatory Visit (INDEPENDENT_AMBULATORY_CARE_PROVIDER_SITE_OTHER): Payer: Medicare Other | Admitting: Family Medicine

## 2011-10-03 ENCOUNTER — Encounter: Payer: Self-pay | Admitting: Family Medicine

## 2011-10-03 VITALS — BP 104/64 | HR 68 | Temp 97.8°F | Ht 63.0 in | Wt 164.0 lb

## 2011-10-03 DIAGNOSIS — E119 Type 2 diabetes mellitus without complications: Secondary | ICD-10-CM

## 2011-10-03 DIAGNOSIS — K219 Gastro-esophageal reflux disease without esophagitis: Secondary | ICD-10-CM

## 2011-10-03 DIAGNOSIS — I10 Essential (primary) hypertension: Secondary | ICD-10-CM

## 2011-10-03 DIAGNOSIS — E7800 Pure hypercholesterolemia, unspecified: Secondary | ICD-10-CM

## 2011-10-03 DIAGNOSIS — N23 Unspecified renal colic: Secondary | ICD-10-CM

## 2011-10-03 LAB — POCT URINALYSIS DIPSTICK
Blood, UA: NEGATIVE
Glucose, UA: NEGATIVE
Spec Grav, UA: 1.015
Urobilinogen, UA: NEGATIVE
pH, UA: 5

## 2011-10-03 MED ORDER — PIOGLITAZONE HCL-METFORMIN HCL 15-500 MG PO TABS: 1.0000 | ORAL_TABLET | Freq: Two times a day (BID) | ORAL | Status: DC

## 2011-10-03 MED ORDER — PRAVASTATIN SODIUM 40 MG PO TABS: 40.0000 mg | ORAL_TABLET | Freq: Every day | ORAL | Status: DC

## 2011-10-03 NOTE — Patient Instructions (Signed)
Left sided back pain--no evidence of kidney infection, mostly likely is musculoskeletal pain, worsened by coughing.  Try heat (15 minutes 3 times/day) and stretches.  You may also try Tylenol or Advil as needed for pain (take with food, stop if it bothers your stomach)  Cough--is being contributed by postnasal drip.  Lung exam is now normal. Continue Robitussin DM--you can increase the frequency to every 4 to 6 hours, if needed for cough.   I recommend using either Claritin (Loratidine) or Zyrtec (cetirizine) or Coricidin HBP to help dry up your runny nose, which may also help cough.  You can use the ranitidine as needed when you are having heartburn/indigestion.  I also recommend you use it regularly if/when you are using Advil. You can use 2-3 advil every 6 hours, along with food, as needed for your back pain and neck pain.

## 2011-10-03 NOTE — Progress Notes (Signed)
Patient presents for med check, and f/u on her bronchitis.  She is complaining of L-sided back pain, which she refers to as "kidney pain". Pain has been off and on since she left from [place].  Hasn't tried anything for pain.  Denies pain with urination, frequency, urgency or blood in urine.  Denies fevers.  Bronchitis--she is still coughing.  She completed z-pak.  The hycodan syrup helped her sleep at night.  Coughs up thick clear mucus.  Has been using the inhaler about once daily with good results.  She has been using Robitussin DM, but only twice a day.  Denies fevers. Still has runny nose and cough.  Denies sneezing.  Mucus is clear, runny nose.  Denies any sinus headaches. She also brings in her medications and brings in a bottle of ranitidine 150mg  that was prescribed for her while in [place], to be taken twice daily before meals.  She hasn't been taking this, because she couldn't remember what it was prescribed for.  She was having heartburn and indigestion while in [place].  Denies any heartburn now.  DM--compliant with meds.  Denies hypoglycemia.  Checks feet regularly and denies concerns HTN--denies headaches, dizziness, chest pain Hyperlipidemia--compliant with meds, denies side effects  Past Medical History  Diagnosis Date  . Colon polyps   . Hepatitis C   . Diverticulosis   . Diabetes mellitus   . Hypertension   . GERD (gastroesophageal reflux disease)   . Osteoporosis   . Hyperlipidemia     Past Surgical History  Procedure Date  . Cholecystectomy 2004  . Rotator cuff repair     right  . Abdominal hysterectomy     and LSO in 1980's  . Lipoma removal     abdomen  . Breast biopsy     left, benign  . Hemicolectomy     left, for diverticulitis (in [place])    History   Social History  . Marital Status: Single    Spouse Name: N/A    Number of Children: N/A  . Years of Education: N/A   Occupational History  . Not on file.   Social History Main Topics  . Smoking status: Former  Smoker    Quit date: 09/05/1958  . Smokeless tobacco: Never Used  . Alcohol Use: No  . Drug Use: No  . Sexually Active: Not on file   Other Topics Concern  . Not on file   Social History Narrative  . No narrative on file   Current Outpatient Prescriptions on File Prior to Visit  Medication Sig Dispense Refill  . albuterol (PROVENTIL HFA;VENTOLIN HFA) 108 (90 BASE) MCG/ACT inhaler Inhale 2 puffs into the lungs every 6 (six) hours as needed for wheezing or shortness of breath.  18 g  0  . aspirin 81 MG EC tablet Take 81 mg by mouth daily.        Marland Kitchen olmesartan-hydrochlorothiazide (BENICAR HCT) 40-25 MG per tablet Take 1 tablet by mouth daily.  30 tablet  2  . Phenylephrine-DM-GG (MUCINEX FAST-MAX CONGEST COUGH) 2.5-5-100 MG/5ML LIQD Take 30 mLs by mouth daily.      Marland Kitchen Spacer/Aero-Holding Chambers (AEROCHAMBER MV) inhaler Use as instructed  1 each  0  . traMADol (ULTRAM) 50 MG tablet Take 1 tablet (50 mg total) by mouth every 6 (six) hours as needed for pain.  20 tablet  0  . travoprost, benzalkonium, (TRAVATAN) 0.004 % ophthalmic solution Place 1 drop into both eyes at bedtime.        Marland Kitchen  DISCONTD: pioglitazone-metformin (ACTOPLUS MET) 15-500 MG per tablet Take 1 tablet by mouth 2 (two) times daily with a meal.  60 tablet  0  . DISCONTD: pravastatin (PRAVACHOL) 40 MG tablet Take 40 mg by mouth daily.         Current Facility-Administered Medications on File Prior to Visit  Medication Dose Route Frequency Provider Last Rate Last Dose  . albuterol (PROVENTIL) (5 MG/ML) 0.5% nebulizer solution 2.5 mg  2.5 mg Nebulization Once Lavonda Jumbo, MD        Allergies  Allergen Reactions  . Codeine Rash   ROS:  See HPI.  Denies fevers, GI complaints, chest pain, dizziness, swelling, skin rashes or lesions.  Denies any recent heartburn  PHYSICAL EXAM: BP 104/64  Pulse 68  Temp(Src) 97.8 F (36.6 C) (Oral)  Ht 5\' 3"  (1.6 m)  Wt 164 lb (74.39 kg)  BMI 29.05 kg/m2  SpO2 98% Well developed,  pleasant female, with occasional dry cough, in no distress HEENT: PERRL, EOMI.  Nasal mucosa mildly edematous, no purulence.  Sinuses nontender.  OP clear Neck: no lymphadenopathy, thyromegaly or carotid bruit.  Tender at R [place] joint Heart: regular rate and rhythm without murmur Lungs: clear bilaterally with good air movement Abdomen: soft, nontender, no organomegaly or mass Extremities: normal diabetic foot exam--see other section.  There is a soft tissue nodule at distal arch of foot (at distal 1st MTP) that is nontender Skin: no rash Back: no CVA tenderness.  Tender at L SI joint Psych: normal mood, affect, hygiene and grooming  Lab Results  Component Value Date   HGBA1C 6.2* 09/19/2011   Lab Results  Component Value Date   CHOL 148 09/19/2011   HDL 58 09/19/2011   LDLCALC 73 09/19/2011   TRIG 86 09/19/2011   CHOLHDL 2.6 09/19/2011     Chemistry      Component Value Date/Time   NA 140 09/19/2011 1331   K 4.2 09/19/2011 1331   CL 101 09/19/2011 1331   CO2 31 09/19/2011 1331   BUN 19 09/19/2011 1331   CREATININE 1.02 09/19/2011 1331   CREATININE 0.88 06/11/2009 1041      Component Value Date/Time   CALCIUM 9.7 09/19/2011 1331   ALKPHOS 68 09/19/2011 1331   AST 35 09/19/2011 1331   ALT 24 09/19/2011 1331   BILITOT 0.5 09/19/2011 1331     ASSESSMENT/PLAN: 1. Type II or unspecified type diabetes mellitus without mention of complication, not stated as uncontrolled  pioglitazone-metformin (ACTOPLUS MET) 15-500 MG per tablet   controlled  2. Pure hypercholesterolemia  pravastatin (PRAVACHOL) 40 MG tablet   at goal  3. Essential hypertension, benign    4. Kidney pain  POCT Urinalysis Dipstick   low back pain  5. GERD (gastroesophageal reflux disease)     Left sided back pain--reassured no evidence of kidney infection, mostly likely is musculoskeletal pain.  Try heat (15 minutes 3 times/day) and stretches. Tylenol or Advil as needed for pain (take with food, stop if it bothers your  stomach)  Cough--is being contributed by postnasal drip.  Lung exam is now normal. Continue Robitussin DM--you can increase the frequency to every 4 to 6 hours, if needed for cough.   Use Claritin (Loratidine) or Zyrtec (cetirizine) or Coricidin HBP to help dry up runny nose, which may also help cough.  Use the ranitidine as needed for heartburn/indigestion and if/when you are using Advil. 2-3 advil every 6 hours, along with food, as needed for back pain and  neck pain.  F/u 6 months, sooner prn persistence of cough or other concerns

## 2011-11-09 ENCOUNTER — Encounter: Payer: Self-pay | Admitting: Family Medicine

## 2011-11-09 ENCOUNTER — Ambulatory Visit (INDEPENDENT_AMBULATORY_CARE_PROVIDER_SITE_OTHER): Payer: Medicare Other | Admitting: Family Medicine

## 2011-11-09 DIAGNOSIS — M545 Low back pain: Secondary | ICD-10-CM

## 2011-11-09 DIAGNOSIS — R05 Cough: Secondary | ICD-10-CM

## 2011-11-09 DIAGNOSIS — M62830 Muscle spasm of back: Secondary | ICD-10-CM

## 2011-11-09 DIAGNOSIS — K219 Gastro-esophageal reflux disease without esophagitis: Secondary | ICD-10-CM

## 2011-11-09 DIAGNOSIS — M539 Dorsopathy, unspecified: Secondary | ICD-10-CM

## 2011-11-09 MED ORDER — NAPROXEN 500 MG PO TABS: 500.0000 mg | ORAL_TABLET | Freq: Two times a day (BID) | ORAL | Status: AC

## 2011-11-09 MED ORDER — METHOCARBAMOL 500 MG PO TABS: ORAL_TABLET | ORAL | Status: DC

## 2011-11-09 MED ORDER — OMEPRAZOLE MAGNESIUM 20 MG PO TBEC: 20.0000 mg | DELAYED_RELEASE_TABLET | Freq: Every day | ORAL | Status: DC

## 2011-11-09 NOTE — Progress Notes (Signed)
Chief complaint: left sided low back pain, also still has cough-comes and goes  HPI:  L sided back pain has gotten worse.  Mentioned at visit 5 weeks ago, and was in L SI joint area.  Has taken Advil 1 tablet twice daily, with some improvement, temporarily.  Heat seems to help, but only temporarily.  Pain remains localized, denies radiation down leg.  Pain is in flank/paraspinous area, but mainly hurts at [facility] joint.  Slight numbness at lateral L calf, described as a tingling.  Denies weakness.  Recently had bus trip to [place] for [employer]. + sick contacts.  Ongoing cough--dry, hacky.  Occasionally gets up white phlegm.  Denies fevers.  No longer having any runny nose.  Robitussin DM helps, but wears off in 4 hrs.  Has been using inhaler once a day, and it seems to help.  + tightness in chest. Having a little heartburn. Hasn't been taking the ranitidine.  Past Medical History  Diagnosis Date  . Colon polyps   . Hepatitis C   . Diverticulosis   . Diabetes mellitus   . Hypertension   . GERD (gastroesophageal reflux disease)   . Osteoporosis   . Hyperlipidemia     Past Surgical History  Procedure Date  . Cholecystectomy 2004  . Rotator cuff repair     right  . Abdominal hysterectomy     and LSO in 1980's  . Lipoma removal     abdomen  . Breast biopsy     left, benign  . Hemicolectomy     left, for diverticulitis (in [place])    History   Social History  . Marital Status: Single    Spouse Name: N/A    Number of Children: N/A  . Years of Education: N/A   Occupational History  . Not on file.   Social History Main Topics  . Smoking status: Former Smoker    Quit date: 09/05/1958  . Smokeless tobacco: Never Used  . Alcohol Use: No  . Drug Use: No  . Sexually Active: Not on file   Other Topics Concern  . Not on file   Social History Narrative  . No narrative on file   Current Outpatient Prescriptions on File Prior to Visit  Medication Sig Dispense Refill  .  albuterol (PROVENTIL HFA;VENTOLIN HFA) 108 (90 BASE) MCG/ACT inhaler Inhale 2 puffs into the lungs every 6 (six) hours as needed for wheezing or shortness of breath.  18 g  0  . aspirin 81 MG EC tablet Take 81 mg by mouth daily.        Marland Kitchen olmesartan-hydrochlorothiazide (BENICAR HCT) 40-25 MG per tablet Take 1 tablet by mouth daily.  30 tablet  2  . pioglitazone-metformin (ACTOPLUS MET) 15-500 MG per tablet Take 1 tablet by mouth 2 (two) times daily with a meal.  60 tablet  5  . pravastatin (PRAVACHOL) 40 MG tablet Take 1 tablet (40 mg total) by mouth daily.  90 tablet  1  . Spacer/Aero-Holding Chambers (AEROCHAMBER MV) inhaler Use as instructed  1 each  0  . travoprost, benzalkonium, (TRAVATAN) 0.004 % ophthalmic solution Place 1 drop into both eyes at bedtime.         Current Facility-Administered Medications on File Prior to Visit  Medication Dose Route Frequency Provider Last Rate Last Dose  . albuterol (PROVENTIL) (5 MG/ML) 0.5% nebulizer solution 2.5 mg  2.5 mg Nebulization Once Lavonda Jumbo, MD        Allergies  Allergen Reactions  .  Codeine Rash   ROS:  Denies fever, nausea, vomiting, diarrhea, skin rash, fevers, urinary complaints, bowel changes, or weakness.  See HPI  PHYSICAL EXAM: BP 100/68  Pulse 64  Temp(Src) 98 F (36.7 C) (Oral)  Ht 5\' 3"  (1.6 m)  Wt 168 lb (76.204 kg)  BMI 29.76 kg/m2 Well developed female, in no distress.  Occasional spasm of dry cough. Speaking comfortably in full sentences. Spine nontender Tender at L paraspinous muscle with + spasm Tender at L SI joint.  Nontender at sciatic notch. DTR's 2+, normal strength, decreased sensation lateral L calf.   Skin without rash HEENT:  Nasal mucosa minimally edematous, OP clear, sinuses nontender Neck: no lymphadenopathy Heart: regular rate and rhythm without murmur Lungs: clear, fair air movement, no wheezing.  No wheezes or cough with forced expiration Abdomen: soft, nontender, no  mass  ASSESSMENT/PLAN: 1. Cough  DG Chest 2 View  2. Lumbago  naproxen (NAPROSYN) 500 MG tablet  3. Muscle spasm of back  methocarbamol (ROBAXIN) 500 MG tablet  4. GERD (gastroesophageal reflux disease)  omeprazole (PRILOSEC OTC) 20 MG tablet   L-sided back pain--muscle spasm and SI joint pain.  Trial of Naproxen and robaxin.  NSAID precautions reviewed.  Dry cough, shortness of breath Check CXR  GERD Start Prilosec every day  Fu 2 weeks

## 2011-11-09 NOTE — Patient Instructions (Signed)
Please go to [facility] for chest x-ray (order is in computer, you just walk-in between 8:30-4), at [facility]  Take Naproxen twice daily with food.  Do not take any other pain relievers, other than tylenol.    You can use the robaxin only as needed for muscle spasm.  Start taking Prilosec every day.  Don't take the ranitidine, take prilosec instead.  This protects your stomach from the pain medication, and also treats reflux.  Reflux might be contributing to your cough.  If that is the case, and cough improves while taking the Prilosec, you can continue to take it longterm

## 2011-11-10 ENCOUNTER — Ambulatory Visit
Admission: RE | Admit: 2011-11-10 | Discharge: 2011-11-10 | Disposition: A | Discharge status: Home / Self Care | Payer: Medicare Other | Source: Ambulatory Visit | Attending: Family Medicine | Admitting: Family Medicine

## 2011-11-10 ENCOUNTER — Other Ambulatory Visit: Payer: Self-pay | Admitting: Family Medicine

## 2011-11-10 DIAGNOSIS — R05 Cough: Secondary | ICD-10-CM

## 2011-11-10 DIAGNOSIS — Z1231 Encounter for screening mammogram for malignant neoplasm of breast: Secondary | ICD-10-CM

## 2011-11-18 ENCOUNTER — Telehealth: Payer: Self-pay | Admitting: Family Medicine

## 2011-11-18 NOTE — Telephone Encounter (Signed)
Dr. Roxan Hockey called regarding Margaret Adkins.  She had a + interferon gamma release blood test (TB screen).  We reviewed her recent CXR, no active TB on CXR.  Plan is to check sputums, and if negative, consider treatment with rifampin x 4 months.  We discussed that her chronic dry cough may be from reflux.  Some interstitial changes on CXR, unchanged from 2011.

## 2011-11-23 ENCOUNTER — Encounter: Payer: Self-pay | Admitting: Family Medicine

## 2011-11-23 ENCOUNTER — Ambulatory Visit (INDEPENDENT_AMBULATORY_CARE_PROVIDER_SITE_OTHER): Payer: Medicare Other | Admitting: Family Medicine

## 2011-11-23 VITALS — BP 118/70 | HR 64 | Ht 63.0 in | Wt 168.0 lb

## 2011-11-23 DIAGNOSIS — M545 Low back pain: Secondary | ICD-10-CM

## 2011-11-23 DIAGNOSIS — R05 Cough: Secondary | ICD-10-CM

## 2011-11-23 NOTE — Patient Instructions (Addendum)
Continue to check your blood pressures at home.  BP today was okay.  If you start having frequent blood pressures of <100 at home, then please call us to adjust your medications.  For now, continue full tablet of your blood pressure pill.  If your cough and/or heartburn recur, start taking Prilosec OTC--this is available without a prescription.  Back pain--trial of heat.  You can continue to use the naproxen as needed.  You probably should take Prilosec when you take naproxen (only once daily) in order to protect your stomach from ulcers.  Follow up with Dr. Magnus Ivan regarding your knee (and [facility]AT papers), as well as for your pain in your chest/clavicle/[place] joint

## 2011-11-23 NOTE — Progress Notes (Signed)
Patient presents for 2 week follow up on cough and back pain.  Cough is improved--very rare cough, just once a day.  Heartburn has resolved. She was looking through her medications at the visit today, and realized that she never got the Prilosec, only the prescriptions that were sent (this was an OTC recommendation).    Back pain--much improved with the naproxen--took it regularly initially, now just if needed (took one last week).  Also hasn't been needing the muscle relaxant very often.  It is effective when she takes it.  Since last visit, she was sent to [facility] for abnormal TB screening (interferon gamma release blood test).  She is currently in process of getting sputum samples.  When these are negative, she will be treated (for the equivalent of being +PPD), per Dr. Burnice Logan.    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 She was told she is able to by the health department, but they wouldn't provide note.  Her recent CXR did NOT show any active TB (some chronic interstitial changes).  She also needs handicap placard.  She has appointment with Dr. Magnus Ivan, as she is planning to have surgery on her L knee.  She has pain in her left knee.  Doesn't use assistive device.  Also continues to have pain at her [place] joint.  Also needs form in order to continue to receive [employer] transportation.  Forms were reviewed (they were filled out incorrectly, as the patient filled out the entire form, including where doctor was supposed to sign).  It appears that the qualifications are that she is unable to embark/disembark on a regular bus, or unable to get to a regular bus stop.  Given that she is having ongoing knee problems that are treated by Dr. Magnus Ivan (and NOT treated here), she agreed to present new forms to Dr. Magnus Ivan to fill out.  Past Medical History  Diagnosis Date  . Colon polyps   . Hepatitis C   . Diverticulosis   . Diabetes mellitus   . Hypertension   . GERD  (gastroesophageal reflux disease)   . Osteoporosis   . Hyperlipidemia     Past Surgical History  Procedure Date  . Cholecystectomy 2004  . Rotator cuff repair     right  . Abdominal hysterectomy     and LSO in 1980's  . Lipoma removal     abdomen  . Breast biopsy     left, benign  . Hemicolectomy     left, for diverticulitis (in [place])    History   Social History  . Marital Status: Single    Spouse Name: N/A    Number of Children: N/A  . Years of Education: N/A   Occupational History  . Not on file.   Social History Main Topics  . Smoking status: Former Smoker    Quit date: 09/05/1958  . Smokeless tobacco: Never Used  . Alcohol Use: No  . Drug Use: No  . Sexually Active: Not on file   Other Topics Concern  . Not on file   Social History Narrative  . No narrative on file   Current Outpatient Prescriptions on File Prior to Visit  Medication Sig Dispense Refill  . albuterol (PROVENTIL HFA;VENTOLIN HFA) 108 (90 BASE) MCG/ACT inhaler Inhale 2 puffs into the lungs every 6 (six) hours as needed for wheezing or shortness of breath.  18 g  0  . aspirin 81 MG EC tablet Take 81  mg by mouth daily.        . methocarbamol (ROBAXIN) 500 MG tablet Take 1-2 tablets every 6-8 hours as needed for muscle spasm in back  30 tablet  0  . naproxen (NAPROSYN) 500 MG tablet Take 1 tablet (500 mg total) by mouth 2 (two) times daily with a meal.  30 tablet  0  . olmesartan-hydrochlorothiazide (BENICAR HCT) 40-25 MG per tablet Take 1 tablet by mouth daily.  30 tablet  2  . omeprazole (PRILOSEC OTC) 20 MG tablet Take 1 tablet (20 mg total) by mouth daily.  30 tablet  0  . pioglitazone-metformin (ACTOPLUS MET) 15-500 MG per tablet Take 1 tablet by mouth 2 (two) times daily with a meal.  60 tablet  5  . pravastatin (PRAVACHOL) 40 MG tablet Take 1 tablet (40 mg total) by mouth daily.  90 tablet  1  . Spacer/Aero-Holding Chambers (AEROCHAMBER MV) inhaler Use as instructed  1 each  0  . travoprost,  benzalkonium, (TRAVATAN) 0.004 % ophthalmic solution Place 1 drop into both eyes at bedtime.         Current Facility-Administered Medications on File Prior to Visit  Medication Dose Route Frequency Provider Last Rate Last Dose  . albuterol (PROVENTIL) (5 MG/ML) 0.5% nebulizer solution 2.5 mg  2.5 mg Nebulization Once Joselyn Arrow, MD        Allergies  Allergen Reactions  . Codeine Rash   ROS: Denies fevers, heartburn, cough, shortness of breath.  Back pain much improved.  Denies any dizziness, numbness, chest pain, skin rash or other concerns.  PHYSICAL EXAM: BP 96/64  Pulse 64  Ht 5\' 3"  (1.6 m)  Wt 168 lb (76.204 kg)  BMI 29.76 kg/m2 BP 118/70 on repeat by MD with regular size cuff (initial BP done with large cuff by nurse). Neck: no lymphadenopathy or mass Heart: regular rate and rhythm Lungs: clear bilaterally, good air movement Back: no spinal tenderness or muscle spasm. Tender at L SI joint Abdomen: no epigastric tenderness Extremities: no edema Psych: normal mood, affect, hygiene and grooming Neuro: alert and oriented.  Normal gait, strength  ASSESSMENT/PLAN:  1. Cough    resolved.  reviewed CXR results. continue sputum samples as recommended by Health Dept, suspect will be negative  2. Lumbago    left sided, SI joint.  continue NSAID's, heat, muscle relaxants prn for spasm   Cough--resolved without any treatment. If symptoms of heartburn, or cough recur, then may take Prilosec OTC if needed (or Zantac).  Note written allowing her to return to volunteering, as she had negative CXR, no active TB.  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 DMV form filled out for temporary handicapped placard  F/u in July, as scheduled, sooner if needed

## 2011-12-07 ENCOUNTER — Ambulatory Visit: Payer: Medicare Other

## 2011-12-20 ENCOUNTER — Ambulatory Visit
Admission: RE | Admit: 2011-12-20 | Discharge: 2011-12-20 | Disposition: A | Discharge status: Home / Self Care | Payer: Medicare Other | Source: Ambulatory Visit | Attending: Family Medicine | Admitting: Family Medicine

## 2011-12-20 DIAGNOSIS — Z1231 Encounter for screening mammogram for malignant neoplasm of breast: Secondary | ICD-10-CM

## 2012-02-09 ENCOUNTER — Encounter: Payer: Self-pay | Admitting: Medical

## 2012-02-09 ENCOUNTER — Ambulatory Visit (INDEPENDENT_AMBULATORY_CARE_PROVIDER_SITE_OTHER): Payer: Medicare Other | Admitting: Medical

## 2012-02-09 VITALS — BP 130/82 | HR 84 | Temp 98.2°F | Resp 16 | Wt 166.5 lb

## 2012-02-09 DIAGNOSIS — R062 Wheezing: Secondary | ICD-10-CM

## 2012-02-09 DIAGNOSIS — J40 Bronchitis, not specified as acute or chronic: Secondary | ICD-10-CM

## 2012-02-09 MED ORDER — HYDROCODONE-HOMATROPINE 5-1.5 MG/5ML PO SYRP: 5.0000 mL | ORAL_SOLUTION | Freq: Three times a day (TID) | ORAL | Status: AC | PRN

## 2012-02-09 MED ORDER — NITROGLYCERIN 0.4 MG SL SUBL: 0.4000 mg | SUBLINGUAL_TABLET | SUBLINGUAL | Status: DC | PRN

## 2012-02-09 MED ORDER — LEVOFLOXACIN 500 MG PO TABS: 500.0000 mg | ORAL_TABLET | Freq: Every day | ORAL | Status: AC

## 2012-02-09 NOTE — Patient Instructions (Signed)
Begin Levaquin antibiotic, one daily for 7 days.   Use Coricidin HBP for cough and congestion during the day if needed.  Use the prescription Hycodan syrup at bedtime for worse cough.  Use the albuterol inhaler 2-3 times daily for the next several days.  Hydrate well with water, rest, and if not improving by early next week, then call or return.

## 2012-02-09 NOTE — Progress Notes (Signed)
Subjective: Here for c/o illness.  She reports bad cough x 5 days.  She reports productive cough with yellow sputum, chest pain with coughing, cough particularly bad at night keeping her awake.  She reports body aches, headache, ear fullness, nasal drainage, low appetite, energy down.  Using Advil. Using inhaler 2 times daily the last few days.  Denies fever, NVD, rash, sore throat.  No sick contacts specifically but she is a volunteer at the hospital.  She is a nonsmoker.   She needs a refill on her NTG which she hasn't had to use, but its out of date.  No other aggravating or relieving factors.  No other c/o.  The following portions of the patient's history were reviewed and updated as appropriate: allergies, current medications, past family history, past medical history, past social history, past surgical history and problem list.  Past Medical History  Diagnosis Date  . Colon polyps   . Hepatitis C   . Diverticulosis   . Diabetes mellitus   . Hypertension   . GERD (gastroesophageal reflux disease)   . Osteoporosis   . Hyperlipidemia     Objective:   Filed Vitals:   02/09/12 1005  BP: 130/82  Pulse: 84  Temp: 98.2 F (36.8 C)  Resp: 16    General appearance: Alert, WD/WN, no distress, ill appearing                             Skin: warm, no rash, no diaphoresis                           Head: no sinus tenderness                            Eyes: conjunctiva normal, corneas clear, PERRLA                            Ears: pearly TMs, external ear canals normal                          Nose: septum midline, turbinates swollen, with erythema and clear discharge             Mouth/throat: MMM, tongue normal, mild pharyngeal erythema                           Neck: supple, no adenopathy, no thyromegaly, nontender                          Heart: RRR, normal S1, S2, no murmurs                         Lungs: +bronchial breath sounds, +scattered rhonchi, no wheezes, no rales   Extremities: no edema, nontender     Assessment and Plan:   Encounter Diagnoses  Name Primary?  . Bronchitis Yes  . Wheezing    Prescription given today for Levaquin and Hycodan syrup as below.  Suggested symptomatic OTC remedies for cough and congestion such as Coricidin HBP.  Call/return in 2-3 days if symptoms are worse or not improving.

## 2012-03-06 ENCOUNTER — Telehealth: Payer: Self-pay | Admitting: Family Medicine

## 2012-03-06 DIAGNOSIS — I10 Essential (primary) hypertension: Secondary | ICD-10-CM

## 2012-03-06 MED ORDER — OLMESARTAN MEDOXOMIL-HCTZ 40-25 MG PO TABS: 1.0000 | ORAL_TABLET | Freq: Every day | ORAL | Status: DC

## 2012-03-06 NOTE — Telephone Encounter (Signed)
Patient was notified that the samples are up front for her to pick up. CLS

## 2012-04-02 ENCOUNTER — Encounter: Payer: Medicare Other | Admitting: Family Medicine

## 2012-06-11 ENCOUNTER — Telehealth: Payer: Self-pay | Admitting: Family Medicine

## 2012-06-11 DIAGNOSIS — E7800 Pure hypercholesterolemia, unspecified: Secondary | ICD-10-CM

## 2012-06-11 MED ORDER — PRAVASTATIN SODIUM 40 MG PO TABS: 40.0000 mg | ORAL_TABLET | Freq: Every day | ORAL | Status: DC

## 2012-06-11 NOTE — Telephone Encounter (Signed)
Done

## 2012-06-27 ENCOUNTER — Other Ambulatory Visit: Payer: Self-pay | Admitting: Family Medicine

## 2012-06-27 NOTE — Telephone Encounter (Signed)
Pt needs to schedule appt

## 2012-07-24 ENCOUNTER — Other Ambulatory Visit: Payer: Self-pay | Admitting: Family Medicine

## 2012-08-13 ENCOUNTER — Encounter: Payer: Medicare Other | Admitting: Family Medicine

## 2012-08-17 ENCOUNTER — Encounter: Payer: Self-pay | Admitting: Medical

## 2012-08-17 ENCOUNTER — Ambulatory Visit (INDEPENDENT_AMBULATORY_CARE_PROVIDER_SITE_OTHER): Payer: Medicare Other | Admitting: Medical

## 2012-08-17 ENCOUNTER — Telehealth: Payer: Self-pay | Admitting: Family Medicine

## 2012-08-17 VITALS — BP 150/90 | HR 62 | Temp 98.1°F | Resp 14 | Ht 63.0 in | Wt 166.0 lb

## 2012-08-17 DIAGNOSIS — R062 Wheezing: Secondary | ICD-10-CM

## 2012-08-17 DIAGNOSIS — E785 Hyperlipidemia, unspecified: Secondary | ICD-10-CM

## 2012-08-17 DIAGNOSIS — R05 Cough: Secondary | ICD-10-CM

## 2012-08-17 DIAGNOSIS — E119 Type 2 diabetes mellitus without complications: Secondary | ICD-10-CM

## 2012-08-17 DIAGNOSIS — R0602 Shortness of breath: Secondary | ICD-10-CM

## 2012-08-17 DIAGNOSIS — I10 Essential (primary) hypertension: Secondary | ICD-10-CM

## 2012-08-17 LAB — CBC WITH DIFFERENTIAL/PLATELET
Lymphocytes Relative: 40 % (ref 12–46)
Lymphs Abs: 3.1 10*3/uL (ref 0.7–4.0)
MCV: 89 fL (ref 78.0–100.0)
Neutro Abs: 3.9 10*3/uL (ref 1.7–7.7)
Neutrophils Relative %: 51 % (ref 43–77)
Platelets: 201 10*3/uL (ref 150–400)
RBC: 4.08 MIL/uL (ref 3.87–5.11)
WBC: 7.7 10*3/uL (ref 4.0–10.5)

## 2012-08-17 LAB — COMPREHENSIVE METABOLIC PANEL
CO2: 29 mEq/L (ref 19–32)
Calcium: 9.9 mg/dL (ref 8.4–10.5)
Glucose, Bld: 112 mg/dL — ABNORMAL HIGH (ref 70–99)
Sodium: 142 mEq/L (ref 135–145)
Total Bilirubin: 0.6 mg/dL (ref 0.3–1.2)
Total Protein: 7.4 g/dL (ref 6.0–8.3)

## 2012-08-17 LAB — POCT URINALYSIS DIPSTICK
Ketones, UA: NEGATIVE
Protein, UA: NEGATIVE
Spec Grav, UA: <=1.005
pH, UA: 7

## 2012-08-17 LAB — LIPID PANEL
LDL Cholesterol: 78 mg/dL (ref 0–99)
VLDL: 17 mg/dL (ref 0–40)

## 2012-08-17 NOTE — Progress Notes (Signed)
Subjective: Here for multiple c/o.  Was initially suppose to be here for fasting med check, but came in with dyspnea and cough spell.  C/o cough x 62mo.  Coughs every day, coughing so hard that she has "pulled something in her stomach."  Not sure of the triggers.  She notes sore nose, congestion, runny nose.  She does report some wheezing and shortness of breath.  Denies sore throat, fever.  Uses inhaler (albuterol), uses this twice day.  Sometimes has to use more.  She has been on Advair in the past but not anytime recently.  Has been here for this prior, but symptoms persist.  No prior diagnosis of asthma, COPD.  Smoked in the 60's, approx 5 pack year hx/o  HTN and hyperlipidemia - compliant with medication.  No c/o.  Diabetes - glucose in the 90-100s in the mornings.  Checks glucose most days in the morning.    Past Medical History  Diagnosis Date  . Colon polyps   . Hepatitis C   . Diverticulosis   . Diabetes mellitus   . Hypertension   . GERD (gastroesophageal reflux disease)   . Osteoporosis   . Hyperlipidemia    Objective: Gen: wd, wn, but with tachypnea and cough Skin: warm, dry HEENT: unremarkable Lungs: faint expiratory wheezes, +cough. No rales. Heart: RRR, normal S2, S2, no murmurs Ext: no edema   Adult ECG Report  Indication: HTN, SOB  Rate: 66 bpm  Rhythm: normal sinus rhythm  QRS Axis: 55 degrees  PR Interval:  QRS Duration: 76ms  QTc:  Conduction Disturbances: none  Other Abnormalities: none  Patient's cardiac risk factors are: advanced age (older than 31 for men, 40 for women), diabetes mellitus, dyslipidemia and hypertension.  EKG comparison: none   Narrative Interpretation: normal EKG    Assessment: Encounter Diagnoses  Name Primary?  . Wheezing Yes  . SOB (shortness of breath)   . Cough   . Type II or unspecified type diabetes mellitus without mention of complication, not stated as uncontrolled   . Hyperlipidemia   . Essential  hypertension, benign     Plan: Initially gave 1 round of albuterol by nebulizer.  improvement on cough and lung sounds.  discussed her symptoms.  Will set up for PFTs.  Reviewed prior CXR in chart and prior office notes regarind the cough.  Labs today.   Will send refills.  C/t regular glucose testing, healthy diabetic diet.  F/u pending labs.

## 2012-08-17 NOTE — Patient Instructions (Signed)
Begin Symbicort inhaler, 2 puffs twice daily to help control your cough and breathing symptoms.   We will be setting you up to check your lung function.    Use the albuterol inhaler, 2 puffs every 4-6 hours as needed for wheezing and shortness of breath.   Lets have you return in 1-2 weeks after your breathing study to recheck.  On the day you go for the breathing study, DO NOT use the Symbicort that morning.

## 2012-08-17 NOTE — Telephone Encounter (Signed)
Patient is aware of her appointment at [facility] on 08/22/12 @ 1200 pm. CLS

## 2012-08-18 LAB — HEMOGLOBIN A1C
Hgb A1c MFr Bld: 6.5 % — ABNORMAL HIGH (ref <5.7)
Mean Plasma Glucose: 140 mg/dL — ABNORMAL HIGH (ref <117)

## 2012-08-18 LAB — MICROALBUMIN / CREATININE URINE RATIO
Creatinine, Urine: 135.3 mg/dL
Microalb, Ur: 1.46 mg/dL (ref 0.00–1.89)

## 2012-08-21 ENCOUNTER — Other Ambulatory Visit: Payer: Self-pay | Admitting: Family Medicine

## 2012-08-21 MED ORDER — PIOGLITAZONE HCL-METFORMIN HCL 15-500 MG PO TABS: 1.0000 | ORAL_TABLET | Freq: Two times a day (BID) | ORAL | Status: DC

## 2012-08-22 ENCOUNTER — Ambulatory Visit (INDEPENDENT_AMBULATORY_CARE_PROVIDER_SITE_OTHER): Payer: Medicare Other | Admitting: Internal Medicine

## 2012-08-22 DIAGNOSIS — R05 Cough: Secondary | ICD-10-CM

## 2012-08-22 DIAGNOSIS — R0602 Shortness of breath: Secondary | ICD-10-CM

## 2012-08-22 LAB — PULMONARY FUNCTION TEST

## 2012-08-22 NOTE — Progress Notes (Signed)
PFT done today. 

## 2012-08-24 ENCOUNTER — Other Ambulatory Visit: Payer: Self-pay | Admitting: Family Medicine

## 2012-08-24 ENCOUNTER — Other Ambulatory Visit: Payer: Self-pay | Admitting: Medical

## 2012-08-24 MED ORDER — PREDNISONE 10 MG PO TABS: ORAL_TABLET | ORAL | Status: DC

## 2012-09-03 ENCOUNTER — Encounter (HOSPITAL_COMMUNITY): Payer: Self-pay | Admitting: Emergency Medicine

## 2012-09-03 ENCOUNTER — Emergency Department (HOSPITAL_COMMUNITY)
Admission: EM | Admit: 2012-09-03 | Discharge: 2012-09-03 | Disposition: A | Discharge status: Home / Self Care | Payer: Medicare Other | Attending: Emergency Medicine | Admitting: Emergency Medicine

## 2012-09-03 ENCOUNTER — Emergency Department (HOSPITAL_COMMUNITY): Payer: Medicare Other

## 2012-09-03 DIAGNOSIS — R05 Cough: Secondary | ICD-10-CM | POA: Insufficient documentation

## 2012-09-03 DIAGNOSIS — R0602 Shortness of breath: Secondary | ICD-10-CM | POA: Insufficient documentation

## 2012-09-03 DIAGNOSIS — K219 Gastro-esophageal reflux disease without esophagitis: Secondary | ICD-10-CM | POA: Insufficient documentation

## 2012-09-03 DIAGNOSIS — R0781 Pleurodynia: Secondary | ICD-10-CM

## 2012-09-03 DIAGNOSIS — X58XXXA Exposure to other specified factors, initial encounter: Secondary | ICD-10-CM | POA: Insufficient documentation

## 2012-09-03 DIAGNOSIS — R079 Chest pain, unspecified: Secondary | ICD-10-CM | POA: Insufficient documentation

## 2012-09-03 DIAGNOSIS — Z860100 Personal history of colon polyps, unspecified: Secondary | ICD-10-CM | POA: Insufficient documentation

## 2012-09-03 DIAGNOSIS — R51 Headache: Secondary | ICD-10-CM | POA: Insufficient documentation

## 2012-09-03 DIAGNOSIS — Z87891 Personal history of nicotine dependence: Secondary | ICD-10-CM | POA: Insufficient documentation

## 2012-09-03 DIAGNOSIS — Y929 Unspecified place or not applicable: Secondary | ICD-10-CM | POA: Insufficient documentation

## 2012-09-03 DIAGNOSIS — Y939 Activity, unspecified: Secondary | ICD-10-CM | POA: Insufficient documentation

## 2012-09-03 DIAGNOSIS — E119 Type 2 diabetes mellitus without complications: Secondary | ICD-10-CM | POA: Insufficient documentation

## 2012-09-03 DIAGNOSIS — I10 Essential (primary) hypertension: Secondary | ICD-10-CM | POA: Insufficient documentation

## 2012-09-03 DIAGNOSIS — E785 Hyperlipidemia, unspecified: Secondary | ICD-10-CM | POA: Insufficient documentation

## 2012-09-03 DIAGNOSIS — Z79899 Other long term (current) drug therapy: Secondary | ICD-10-CM | POA: Insufficient documentation

## 2012-09-03 DIAGNOSIS — S298XXA Other specified injuries of thorax, initial encounter: Secondary | ICD-10-CM | POA: Insufficient documentation

## 2012-09-03 DIAGNOSIS — R059 Cough, unspecified: Secondary | ICD-10-CM | POA: Insufficient documentation

## 2012-09-03 DIAGNOSIS — Z7982 Long term (current) use of aspirin: Secondary | ICD-10-CM | POA: Insufficient documentation

## 2012-09-03 DIAGNOSIS — J3489 Other specified disorders of nose and nasal sinuses: Secondary | ICD-10-CM | POA: Insufficient documentation

## 2012-09-03 LAB — POCT I-STAT TROPONIN I

## 2012-09-03 LAB — COMPREHENSIVE METABOLIC PANEL
ALT: 29 U/L (ref 0–35)
AST: 36 U/L (ref 0–37)
Albumin: 4.2 g/dL (ref 3.5–5.2)
CO2: 27 mEq/L (ref 19–32)
Chloride: 100 mEq/L (ref 96–112)
GFR calc non Af Amer: 45 mL/min — ABNORMAL LOW (ref >90)
Sodium: 141 mEq/L (ref 135–145)
Total Bilirubin: 0.3 mg/dL (ref 0.3–1.2)

## 2012-09-03 LAB — CBC WITH DIFFERENTIAL/PLATELET
Basophils Relative: 0 % (ref 0–1)
Eosinophils Absolute: 0.1 10*3/uL (ref 0.0–0.7)
Hemoglobin: 14.2 g/dL (ref 12.0–15.0)
Lymphs Abs: 3.7 10*3/uL (ref 0.7–4.0)
MCH: 30 pg (ref 26.0–34.0)
MCHC: 32.8 g/dL (ref 30.0–36.0)
MCV: 91.4 fL (ref 78.0–100.0)
Monocytes Absolute: 0.7 10*3/uL (ref 0.1–1.0)
Neutrophils Relative %: 48 % (ref 43–77)
Platelets: ADEQUATE 10*3/uL (ref 150–400)

## 2012-09-03 MED ORDER — HYDROCODONE-ACETAMINOPHEN 5-325 MG PO TABS: ORAL_TABLET | ORAL | Status: DC

## 2012-09-03 NOTE — ED Notes (Signed)
Pt states she has been sob, had left rib pain, and RUQ pain for 6 months. Pt states "I got hepatitis so my liver is hurting." Pt states her sob is worse recently and "I just wanna get checked out before the new year." Pt has been to md multiple times for same complaint. Pt placed on cardiac/spo2/bp monitor. VSS, WDL.

## 2012-09-03 NOTE — ED Notes (Signed)
Onset shortness of breath and dry cough for 6 months seen Primary Doctor completed a breathing test one week ago and was placed on prednisone and a inhaler.  States helps intermittent.  Concerned of left rib stabbing pain 7-10/10.

## 2012-09-03 NOTE — ED Provider Notes (Signed)
History     CSN: [id number]  Arrival date & time 09/03/12  1451   First MD Initiated Contact with Patient 09/03/12 1807      Chief Complaint  Patient presents with  . Shortness of Breath  . Headache  . Rib Injury    (Consider location/radiation/quality/duration/timing/severity/associated sxs/prior treatment) HPI Comments: Patient presents with complaint of 6 months of left lateral rib pain that has been worse over the past one week. Patient denies injury to the area. She states that she has lipomas. Pain is worse with palpation and deep breathing. Patient states she feels short of breath at times. Patient had PFTs performed approximately 2 weeks ago and she was started on prednisone and an inhaler which she states helped the pain somewhat. Otherwise she takes Aleve which helps. No chest pain, nausea, vomiting, diarrhea. Patient denies fever. Occasional coughing that is nonproductive, does not seem to be worse over the past week. No lower extremity edema. Patient has primary care followup. Onset gradual.   Patient is a 71 y.o. female presenting with shortness of breath and headaches. The history is provided by the patient.  Shortness of Breath  Associated symptoms include chest pain (left rib pain), rhinorrhea, cough and shortness of breath. Pertinent negatives include no fever and no sore throat.  Headache  Associated symptoms include shortness of breath. Pertinent negatives include no fever, no nausea and no vomiting.    Past Medical History  Diagnosis Date  . Colon polyps   . Hepatitis C   . Diverticulosis   . Diabetes mellitus   . Hypertension   . GERD (gastroesophageal reflux disease)   . Osteoporosis   . Hyperlipidemia     Past Surgical History  Procedure Date  . Cholecystectomy 2004  . Rotator cuff repair     right  . Abdominal hysterectomy     and LSO in 1980's  . Lipoma removal     abdomen  . Breast biopsy     left, benign  . Hemicolectomy     left, for  diverticulitis (in [place])    No family history on file.  History  Substance Use Topics  . Smoking status: Former Smoker    Quit date: 09/05/1958  . Smokeless tobacco: Never Used  . Alcohol Use: No    OB History    Grav Para Term Preterm Abortions TAB SAB Ect Mult Living                  Review of Systems  Constitutional: Negative for fever.  HENT: Positive for rhinorrhea. Negative for sore throat.   Eyes: Negative for redness.  Respiratory: Positive for cough and shortness of breath.   Cardiovascular: Positive for chest pain (left rib pain). Negative for leg swelling.  Gastrointestinal: Negative for nausea, vomiting, abdominal pain and diarrhea.  Genitourinary: Negative for dysuria.  Musculoskeletal: Negative for myalgias.  Skin: Negative for rash.  Neurological: Positive for headaches.    Allergies  Codeine  Home Medications   Current Outpatient Rx  Name  Route  Sig  Dispense  Refill  . ALBUTEROL SULFATE HFA 108 (90 BASE) MCG/ACT IN AERS   Inhalation   Inhale 2 puffs into the lungs every 6 (six) hours as needed for wheezing or shortness of breath.   18 g   0   . ASPIRIN 81 MG PO TBEC   Oral   Take 81 mg by mouth daily.           Marland Kitchen METHOCARBAMOL 500  MG PO TABS      Take 1-2 tablets every 6-8 hours as needed for muscle spasm in back   30 tablet   0   . MOMETASONE FUROATE 50 MCG/ACT NA SUSP   Nasal   Place 2 sprays into the nose daily.         Marland Kitchen NAPROXEN 500 MG PO TABS   Oral   Take 1 tablet (500 mg total) by mouth 2 (two) times daily with a meal.   30 tablet   0   . NITROGLYCERIN 0.4 MG SL SUBL   Sublingual   Place 1 tablet (0.4 mg total) under the tongue every 5 (five) minutes as needed for chest pain.   25 tablet   0   . OLMESARTAN MEDOXOMIL-HCTZ 40-25 MG PO TABS   Oral   Take 1 tablet by mouth daily.   30 tablet   0   . OMEPRAZOLE MAGNESIUM 20 MG PO TBEC   Oral   Take 40 mg by mouth daily.         Marland Kitchen PIOGLITAZONE HCL-METFORMIN HCL  15-500 MG PO TABS   Oral   Take 1 tablet by mouth 2 (two) times daily with a meal.   60 tablet   6   . PRAVASTATIN SODIUM 40 MG PO TABS      TAKE ONE TABLET BY MOUTH ONE TIME DAILY   30 tablet   0   . PREDNISONE 10 MG PO TABS      6/5/4/3/2/1 taper   21 tablet   0   . AEROCHAMBER MV MISC      Use as instructed   1 each   0     Please show patient proper way to use with albuter ...   . TRAVOPROST 0.004 % OP SOLN   Both Eyes   Place 1 drop into both eyes at bedtime.             BP 113/51  Pulse 83  Temp 97.6 F (36.4 C) (Oral)  Resp 24  SpO2 100%  Physical Exam  Nursing note and vitals reviewed. Constitutional: She appears well-developed and well-nourished.  HENT:  Head: Normocephalic and atraumatic.  Eyes: Conjunctivae normal are normal. Right eye exhibits no discharge. Left eye exhibits no discharge.  Neck: Normal range of motion. Neck supple.  Cardiovascular: Normal rate, regular rhythm and normal heart sounds.   No murmur heard. Pulmonary/Chest: Effort normal and breath sounds normal. No respiratory distress. She has no wheezes.       Point tender left lateral ribs. No overlying erythema or cellulitis noted. Several lipomas palpated in skin. No step-off of ribs.  Abdominal: Soft. There is no tenderness.  Musculoskeletal: She exhibits no edema.       No lower extremity edema.  Neurological: She is alert.  Skin: Skin is warm and dry.  Psychiatric: She has a normal mood and affect.    ED Course  Procedures (including critical care time)  Labs Reviewed  COMPREHENSIVE METABOLIC PANEL - Abnormal; Notable for the following:    Glucose, Bld 163 (*)     Creatinine, Ser 1.19 (*)     GFR calc non Af Amer 45 (*)     GFR calc Af Amer 52 (*)     All other components within normal limits  CBC WITH DIFFERENTIAL  POCT I-STAT TROPONIN I  URINALYSIS, ROUTINE W REFLEX MICROSCOPIC   Dg Ribs Unilateral W/chest Left  09/03/2012  *RADIOLOGY REPORT*  Clinical  Data:  Short of breath.  Productive cough.  Left posterior rib pain.  LEFT RIBS AND CHEST - 3+ VIEW  Comparison: 11/10/2011 and previous  Findings: Heart size is normal.  There are chronic abnormal interstitial markings throughout the lungs.  These are questionably more pronounced in the right upper lobe today raising the possibility of some inflammation in that area.  No dense consolidation.  Left-sided rib films do not show a fracture.  IMPRESSION: No demonstrable rib fracture.  Chronic pulmonary fibrosis.  Markings are questionably more prominent in the right upper lobe.  This could be progression of fibrosis or there could be some superimposed pulmonary inflammation.   Original Report Authenticated By: Paulina Fusi, M.D.      1. Rib pain     6:14 PM Patient seen and examined. X-ray results reviewed. UA pending.     Date: 09/03/2012  Rate: 88  Rhythm: normal sinus rhythm  QRS Axis: normal  Intervals: normal  ST/T Wave abnormalities: normal  Conduction Disutrbances:none  Narrative Interpretation:   Old EKG Reviewed: none available   Vital signs reviewed and are as follows: Filed Vitals:   09/03/12 1811  BP: 113/51  Pulse: 83  Temp:   Resp: 24   6:22 PM D/w Dr. Blinda Leatherwood who will see.   6:30 PM Patient agreeable to d/c to home. She will continue use of Aleve since that has been working. Vicodin for severe pain. Counseled patient to take only 1/2 tab sparingly. Discussed drowsiness and fall risk.   Urged PCP and pulmonary follow-up.     MDM  Patient with obviously reproducible L rib pain, chronic, worse over 1 week. Patient jumps with minimal pressure in area. This is musculoskeletal pain. Uncertain if this is related to lipoma in area.   No indication for CT of chest today. Exam not c/w PE or ACS. She has chronic appearing lung disease which will require outpatient follow-up. She will continue inhalers as instructed.   Headache: mentioned in triage note but patient did not  mention. She is grossly neurologically intact.            Renne Crigler, [place] 09/03/12 [phone]

## 2012-09-03 NOTE — ED Provider Notes (Signed)
Medical screening examination/treatment/procedure(s) were conducted as a shared visit with non-physician practitioner(s) and myself.  I personally evaluated the patient during the encounter.  Patient's pain is chronic and appears to be of musculoskeletal origin. Workup has been negative and the patient could be discharged for followup with primary doctor.  Gilda Crease, MD 09/03/12 [phone]

## 2012-09-03 NOTE — ED Notes (Signed)
Headache pain 8/10 throbbing onset this morning

## 2012-09-07 ENCOUNTER — Encounter: Payer: Self-pay | Admitting: Medical

## 2012-09-07 ENCOUNTER — Telehealth: Payer: Self-pay | Admitting: Family Medicine

## 2012-09-07 ENCOUNTER — Other Ambulatory Visit: Payer: Self-pay | Admitting: Family Medicine

## 2012-09-07 NOTE — Telephone Encounter (Signed)
Patient is aware of her appointment to see Dr. Maple Hudson on 10/04/12 @ 230 pm. CLS [phone]

## 2012-09-14 ENCOUNTER — Telehealth: Payer: Self-pay | Admitting: Family Medicine

## 2012-09-14 NOTE — Telephone Encounter (Signed)
Patient is aware of her appointment to Dr. Maple Hudson on 10/04/12 @ 230 pm at [facility]. [phone]. CLS

## 2012-10-01 ENCOUNTER — Ambulatory Visit: Payer: Medicare Other | Admitting: Medical

## 2012-10-02 ENCOUNTER — Ambulatory Visit (INDEPENDENT_AMBULATORY_CARE_PROVIDER_SITE_OTHER): Payer: Medicare Other | Admitting: Medical

## 2012-10-02 ENCOUNTER — Encounter: Payer: Self-pay | Admitting: Medical

## 2012-10-02 VITALS — BP 118/78 | HR 78 | Temp 97.9°F | Resp 16 | Wt 163.5 lb

## 2012-10-02 DIAGNOSIS — I10 Essential (primary) hypertension: Secondary | ICD-10-CM

## 2012-10-02 DIAGNOSIS — R05 Cough: Secondary | ICD-10-CM

## 2012-10-02 DIAGNOSIS — R300 Dysuria: Secondary | ICD-10-CM

## 2012-10-02 DIAGNOSIS — M1990 Unspecified osteoarthritis, unspecified site: Secondary | ICD-10-CM

## 2012-10-02 DIAGNOSIS — E785 Hyperlipidemia, unspecified: Secondary | ICD-10-CM

## 2012-10-02 DIAGNOSIS — R942 Abnormal results of pulmonary function studies: Secondary | ICD-10-CM

## 2012-10-02 DIAGNOSIS — K219 Gastro-esophageal reflux disease without esophagitis: Secondary | ICD-10-CM

## 2012-10-02 DIAGNOSIS — M129 Arthropathy, unspecified: Secondary | ICD-10-CM

## 2012-10-02 DIAGNOSIS — E119 Type 2 diabetes mellitus without complications: Secondary | ICD-10-CM

## 2012-10-02 LAB — POCT URINALYSIS DIPSTICK
Glucose, UA: NEGATIVE
Nitrite, UA: NEGATIVE
Protein, UA: NEGATIVE
Spec Grav, UA: 1.015
Urobilinogen, UA: NEGATIVE

## 2012-10-02 NOTE — Progress Notes (Signed)
Subjective: Here for f/u.   Last visit she was suppose to be here for routine med check f/u but she had acute issue with cough, wheezing.   She has been using the Symbicort and Albuterol inhalers with some improvement, but still has cough, some difficulty breathing.  She went for PFTs and is here to discuss results.  She is also her to discuss the recent lab results .     She reports some abdominal pain, mostly in upper abdomen.   Seems to be worse with cough spells.  Gets a catch in her stomach with the cough.  Denies nausea, vomiting.  Had some diarrhea last week.  No blood in stool.   Getting up white phlegm.  Denies fever.   Doesn't feel sick, but feels a little off today.   Compliant with her medications for diabetes, blood pressure, cholesterol.  She has her medications but not sure about some of them.    Allergies  Allergen Reactions  . Codeine Rash    Current Outpatient Prescriptions on File Prior to Visit  Medication Sig Dispense Refill  . albuterol (PROVENTIL HFA;VENTOLIN HFA) 108 (90 BASE) MCG/ACT inhaler Inhale 2 puffs into the lungs every 6 (six) hours as needed for wheezing or shortness of breath.  18 g  0  . aspirin 81 MG EC tablet Take 81 mg by mouth daily.        . diphenhydrAMINE (BENADRYL) 25 mg capsule Take 25 mg by mouth every 6 (six) hours as needed.      Marland Kitchen HYDROcodone-acetaminophen (NORCO/VICODIN) 5-325 MG per tablet Take one-half tablet every 6 hours as needed for severe pain  5 tablet  0  . methocarbamol (ROBAXIN) 500 MG tablet Take 1-2 tablets every 6-8 hours as needed for muscle spasm in back  30 tablet  0  . mometasone (NASONEX) 50 MCG/ACT nasal spray Place 2 sprays into the nose daily.      . naproxen (NAPROSYN) 500 MG tablet Take 1 tablet (500 mg total) by mouth 2 (two) times daily with a meal.  30 tablet  0  . nitroGLYCERIN (NITROSTAT) 0.4 MG SL tablet Place 1 tablet (0.4 mg total) under the tongue every 5 (five) minutes as needed for chest pain.  25 tablet  0    . olmesartan-hydrochlorothiazide (BENICAR HCT) 40-25 MG per tablet Take 1 tablet by mouth daily.  30 tablet  0  . omeprazole (PRILOSEC OTC) 20 MG tablet Take 40 mg by mouth daily.      . pioglitazone-metformin (ACTOPLUS MET) 15-500 MG per tablet Take 1 tablet by mouth 2 (two) times daily with a meal.  60 tablet  6  . pravastatin (PRAVACHOL) 40 MG tablet TAKE ONE TABLET BY MOUTH EVERY DAY  90 tablet  0  . predniSONE (DELTASONE) 10 MG tablet 6/5/4/3/2/1 taper  21 tablet  0  . travoprost, benzalkonium, (TRAVATAN) 0.004 % ophthalmic solution Place 1 drop into both eyes at bedtime.         Current Facility-Administered Medications on File Prior to Visit  Medication Dose Route Frequency Provider Last Rate Last Dose  . albuterol (PROVENTIL) (5 MG/ML) 0.5% nebulizer solution 2.5 mg  2.5 mg Nebulization Once Joselyn Arrow, MD        Past Medical History  Diagnosis Date  . Colon polyps   . Hepatitis C   . Diverticulosis   . Diabetes mellitus   . Hypertension   . GERD (gastroesophageal reflux disease)   . Osteoporosis   . Hyperlipidemia  Past Surgical History  Procedure Date  . Cholecystectomy 2004  . Rotator cuff repair     right  . Abdominal hysterectomy     and LSO in 1980's  . Lipoma removal     abdomen  . Breast biopsy     left, benign  . Hemicolectomy     left, for diverticulitis (in [place])    No family history on file.  History   Social History  . Marital Status: Single    Spouse Name: N/A    Number of Children: N/A  . Years of Education: N/A   Occupational History  . Not on file.   Social History Main Topics  . Smoking status: Former Smoker    Quit date: 09/05/1958  . Smokeless tobacco: Never Used  . Alcohol Use: No  . Drug Use: No  . Sexually Active: Not on file   Other Topics Concern  . Not on file   Social History Narrative  . No narrative on file    Reviewed their medical, surgical, family, social, medication, and allergy history and updated chart as  appropriate.    Objective: Filed Vitals:   10/02/12 0817  BP: 118/78  Pulse: 78  Temp: 97.9 F (36.6 C)  Resp: 16    General appearance: alert, no distress, WD/WN HEENT: normocephalic, sclerae anicteric, TMs pearly, nares patent, no discharge or erythema, pharynx normal Oral cavity: MMM, no lesions Neck: supple, no lymphadenopathy, no thyromegaly, no masses Heart: RRR, normal S1, S2, no murmurs Lungs: CTA bilaterally, no wheezes, rhonchi, or rales Abdomen: +bs, soft, non tender, non distended, no masses, no hepatomegaly, no splenomegaly Pulses: 2+ symmetric, upper and lower extremities, normal cap refill Rectal: anus normal, no obvious lesions, occult negative stool, chaperoned by nurse  Assessment: Encounter Diagnoses  Name Primary?  . Type II or unspecified type diabetes mellitus without mention of complication, not stated as uncontrolled Yes  . Essential hypertension, benign   . Hyperlipidemia   . Abnormal PFT   . Chronic cough   . Dysuria   . GERD (gastroesophageal reflux disease)   . Arthritis     Plan: Patient Instructions  Cough - follow up with the pulmonologist on 10/04/12.   For now, you can continue the Symbicort inhaler twice daily, and you can continue Albuterol inhaler 2 puffs three times daily for wheezing, shortness of breath or cough spells  Diabetes - continue your  Actoplus met 15/500mg  as usual twice daily.   Your numbers look good at this point.   Cholesterol - continue your Pravachol 40mg  daily at bedtime along with the daily Aspirin 81mg .    For arthritis/joint pain - you can use the Mobic/Meloxicam 7.5mg  once or twice daily as needed.   Don't use this and Naprosyn at the same time.  You had duplicate medications today.  Naprosyn is also for joint pain.  Use one or the other but not both.  For pain, you can use either the Hydrocodone or the Ultram tablets you have, but not both at the same time.  You had duplicated pain medications today as well.  These are only as needed medications.   You had a muscle relaxer medication today, Robaxin.  This is only an as needed medication for muscle spasm.   Continue Omeprazole 40mg  daily 1 hour before breakfast for acid reflux.    continue your Benicar HCT blood pressure medication as usual.   Continue fish oil as usual.   Continue your Nasonex nasal spray as usual.  Continue your eye drops as usual.    For now you can continue benadryl at bedtime for sleep.   We may decide to change this at a later time, but ok for now.    If the belly pain worsens let me know.   Follow-up with pulmonology

## 2012-10-02 NOTE — Patient Instructions (Addendum)
Cough - follow up with the pulmonologist on 10/04/12.   For now, you can continue the Symbicort inhaler twice daily, and you can continue Albuterol inhaler 2 puffs three times daily for wheezing, shortness of breath or cough spells  Diabetes - continue your  Actoplus met 15/500mg  as usual twice daily.   Your numbers look good at this point.   Cholesterol - continue your Pravachol 40mg  daily at bedtime along with the daily Aspirin 81mg .    For arthritis/joint pain - you can use the Mobic/Meloxicam 7.5mg  once or twice daily as needed.   Don't use this and Naprosyn at the same time.  You had duplicate medications today.  Naprosyn is also for joint pain.  Use one or the other but not both.  For pain, you can use either the Hydrocodone or the Ultram tablets you have, but not both at the same time.  You had duplicated pain medications today as well. These are only as needed medications.   You had a muscle relaxer medication today, Robaxin.  This is only an as needed medication for muscle spasm.   Continue Omeprazole 40mg  daily 1 hour before breakfast for acid reflux.    continue your Benicar HCT blood pressure medication as usual.   Continue fish oil as usual.   Continue your Nasonex nasal spray as usual.   Continue your eye drops as usual.    For now you can continue benadryl at bedtime for sleep.   We may decide to change this at a later time, but ok for now.    If the belly pain worsens let me know.

## 2012-10-04 ENCOUNTER — Encounter: Payer: Self-pay | Admitting: Internal Medicine

## 2012-10-04 ENCOUNTER — Telehealth: Payer: Self-pay | Admitting: Medical

## 2012-10-04 ENCOUNTER — Ambulatory Visit (INDEPENDENT_AMBULATORY_CARE_PROVIDER_SITE_OTHER): Payer: Medicare Other | Admitting: Internal Medicine

## 2012-10-04 ENCOUNTER — Other Ambulatory Visit (INDEPENDENT_AMBULATORY_CARE_PROVIDER_SITE_OTHER): Payer: Medicare Other

## 2012-10-04 VITALS — BP 112/70 | HR 97 | Ht 63.0 in | Wt 165.8 lb

## 2012-10-04 DIAGNOSIS — IMO0001 Reserved for inherently not codable concepts without codable children: Secondary | ICD-10-CM

## 2012-10-04 DIAGNOSIS — J984 Other disorders of lung: Secondary | ICD-10-CM

## 2012-10-04 DIAGNOSIS — J8410 Pulmonary fibrosis, unspecified: Secondary | ICD-10-CM

## 2012-10-04 MED ORDER — OMEPRAZOLE 40 MG PO CPDR: 40.0000 mg | DELAYED_RELEASE_CAPSULE | Freq: Every day | ORAL | Status: DC

## 2012-10-04 NOTE — Telephone Encounter (Signed)
LM

## 2012-10-04 NOTE — Patient Instructions (Addendum)
Order-  CT chest with high resolution views and contrast    Dx interstitial fibrosis     ([facility] lab done this month- in Epic)              Lab- ANA, sed rate               Schedule 6 MWT

## 2012-10-04 NOTE — Progress Notes (Signed)
10/04/12- 63 yoF former smoker referred by Kristian Covey, PA. She describes shortness of breath x 6 months with no prior respiratory complaint. Pain left posterior lateral ribs for about the same interval, related to deep breath and twisting. Chronic cough with scant thick white sputum. Occasional night sweats without weight change or adenopathy. No blood or fever and nothing purulent. Has been anemic. History of hepatitis C. She has Ventolin and Symbicort used unevenly. She is unclear that they help. PFT 08/22/12- severe restriction, mild obstruction with resp to BD, DLCo severely reduced. FEV1 0.91/48%, FVC 1.12/ 41%, FEV1/FVC 0.81, FEF 25-75% 1.01/ 46%, TLC 46%, DLCO 28% CXR 11/17/11 IMPRESSION:  1. No acute cardia no pulmonary abnormalities.  2. Chronic interstitial changes, similar to previous exam.  Original Report Authenticated By: Rosealee Albee, M.D. MR chest 09/24/10 IMPRESSION:  1. 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 Although possibly from severe degenerative  arthropathy, the edema in the joint and adjacent osseous structures  raises the possibility of septic joint and osteomyelitis. No extra-  articular fluid collection is identified to provide greater  positive predictive value for septic joint. Correlate with  chronicity in onset of symptoms as well as other signs of infection  in determining the need for treatment or attempt at arthrocentesis.  2. Suspected atypical infectious bronchiolitis in the right lung  apex.  Provider: [name] CT chest 07/20/10 IMPRESSION: Multifocal areas of peribronchial opacities involving  the upper lobes along with diffuse bronchial wall thickening is  most compatible with bronchopneumonia.  Provider: [name]  Prior to Admission medications   Medication Sig Start Date End Date Taking? Authorizing Provider   albuterol (PROVENTIL HFA;VENTOLIN HFA) 108 (90 BASE) MCG/ACT inhaler Inhale 2 puffs into the lungs every 6 (six) hours as needed for wheezing or shortness of breath. 09/19/11 11/01/12 Yes Joselyn Arrow, MD  aspirin 81 MG EC tablet Take 81 mg by mouth daily.     Yes Historical Provider, MD  budesonide-formoterol (SYMBICORT) 160-4.5 MCG/ACT inhaler Inhale 2 puffs into the lungs 2 (two) times daily.   Yes Historical Provider, MD  diphenhydrAMINE (BENADRYL) 25 mg capsule Take 25 mg by mouth every 6 (six) hours as needed.   Yes Historical Provider, MD  fish oil-omega-3 fatty acids 1000 MG capsule Take 2 g by mouth daily.   Yes Historical Provider, MD  HYDROcodone-acetaminophen (NORCO/VICODIN) 5-325 MG per tablet Take one-half tablet every 6 hours as needed for severe pain 09/03/12  Yes Renne Crigler, PA  meloxicam (MOBIC) 7.5 MG tablet Take 7.5 mg by mouth daily.   Yes Historical Provider, MD  methocarbamol (ROBAXIN) 500 MG tablet Take 1-2 tablets every 6-8 hours as needed for muscle spasm in back 11/09/11  Yes Joselyn Arrow, MD  mometasone (NASONEX) 50 MCG/ACT nasal spray Place 2 sprays into the nose daily.   Yes Historical Provider, MD  naproxen (NAPROSYN) 500 MG tablet Take 1 tablet (500 mg total) by mouth 2 (two) times daily with a meal. 11/09/11 11/08/12 Yes Joselyn Arrow, MD  nitroGLYCERIN (NITROSTAT) 0.4 MG SL tablet Place 1 tablet (0.4 mg total) under the tongue every 5 (five) minutes as needed for chest pain. 02/09/12 02/08/13 Yes Kermit Balo Tysinger, PA  olmesartan-hydrochlorothiazide (BENICAR HCT) 40-25 MG per tablet Take 1 tablet by mouth daily. 03/06/12  Yes Kermit Balo Tysinger, PA  omeprazole (PRILOSEC) 40 MG capsule Take 1 capsule (40 mg total) by mouth daily. 10/04/12  Yes  Kermit Balo Tysinger, PA  pioglitazone-metformin (ACTOPLUS MET) 15-500 MG per tablet Take 1 tablet by mouth 2 (two) times daily with a meal. 08/21/12  Yes Kermit Balo Tysinger, PA  pravastatin (PRAVACHOL) 40 MG tablet TAKE ONE TABLET BY MOUTH EVERY DAY 09/07/12   Yes Joselyn Arrow, MD  travoprost, benzalkonium, (TRAVATAN) 0.004 % ophthalmic solution Place 1 drop into both eyes at bedtime.     Yes Historical Provider, MD   Past Medical History  Diagnosis Date  . Colon polyps   . Hepatitis C   . Diverticulosis   . Diabetes mellitus   . Hypertension   . GERD (gastroesophageal reflux disease)   . Osteoporosis   . Hyperlipidemia    Past Surgical History  Procedure Laterality Date  . Cholecystectomy  2004  . Rotator cuff repair      right  . Abdominal hysterectomy      and LSO in 1980's  . Lipoma removal      abdomen  . Breast biopsy      left, benign  . Hemicolectomy      left, for diverticulitis (in [place])   Family History  Problem Relation Age of Onset  . Liver cancer Mother    History   Social History  . Marital Status: Single    Spouse Name: N/A    Number of Children: N/A  . Years of Education: N/A   Occupational History  . Not on file.   Social History Main Topics  . Smoking status: Former Smoker -- 2.00 packs/day for 5 years    Types: Cigarettes    Quit date: 09/05/1958  . Smokeless tobacco: Never Used  . Alcohol Use: No     Comment: past use, quit in 1992  . Drug Use: No     Comment: past use quit in 1992  . Sexually Active: Not on file   Other Topics Concern  . Not on file   Social History Narrative  . No narrative on file   ROS-see HPI Constitutional:   No-   weight loss, night sweats, fevers, chills, fatigue, lassitude. HEENT:   No-  headaches, difficulty swallowing, tooth/dental problems, sore throat,       No-  sneezing, itching, ear ache, nasal congestion, post nasal drip,  CV:  No-   chest pain, orthopnea, PND, swelling in lower extremities, anasarca,                                  dizziness, palpitations Resp: No-   shortness of breath with exertion or at rest.              No-   productive cough,  No non-productive cough,  No- coughing up of blood.              No-   change in color of mucus.  No-  wheezing.   Skin: No-   rash or lesions. GI:  No-   heartburn, indigestion, + RUQ abdominal pain, nausea, vomiting, diarrhea,                 change in bowel habits, loss of appetite GU: No-   dysuria, change in color of urine, no urgency or frequency.  No- flank pain. MS:  +joint pain or swelling.  No- decreased range of motion.  + back pain. Neuro-     nothing unusual Psych:  No- change in mood or affect. No  depression or anxiety.  No memory loss.  OBJ- Physical Exam General- Alert, Oriented, Affect-appropriate, Distress- none acute. Overweight. Skin- rash-none, lesions- none, excoriation- none Lymphadenopathy- none Head- atraumatic            Eyes- Gross vision intact, PERRLA, conjunctivae and secretions clear            Ears- Hearing, canals-normal            Nose- Clear, no-Septal dev, mucus, polyps, erosion, perforation             Throat- Mallampati II , mucosa red , drainage- none, tonsils- atrophic Neck- flexible , trachea midline, no stridor , thyroid nl, carotid no bruit Chest - symmetrical excursion , unlabored           Heart/CV- RRR , no murmur , no gallop  , no rub, nl s1 s2                           - JVD- none , edema- none, stasis changes- none, varices- none           Lung- clear to P&A, wheeze- none, cough+raspy , dullness-none, rub- none           Chest wall-  Abd- tender-no, distended-no, bowel sounds-present, HSM- no Br/ Gen/ Rectal- Not done, not indicated Extrem- cyanosis- none, clubbing, none, atrophy- none, strength- nl Neuro- grossly intact to observation

## 2012-10-08 ENCOUNTER — Telehealth: Payer: Self-pay | Admitting: Internal Medicine

## 2012-10-08 NOTE — Progress Notes (Signed)
Quick Note:  Spoke with patient made her aware of results listed below per CY. Patient verbalized understanding and nothing further needed at this time ______

## 2012-10-08 NOTE — Telephone Encounter (Signed)
Spoke with patient made her aware of results listed below per CY.  Patient verbalized understanding and nothing further needed at this time.  Notes Recorded by Waymon Budge, MD on 10/06/2012 at 4:06 PM Labs- ANA and sed rate are normal.

## 2012-10-08 NOTE — Telephone Encounter (Signed)
Pt called back again. Margaret Adkins °

## 2012-10-08 NOTE — Progress Notes (Signed)
Quick Note:  LMTCB ______ 

## 2012-10-09 ENCOUNTER — Telehealth: Payer: Self-pay | Admitting: Internal Medicine

## 2012-10-09 DIAGNOSIS — IMO0001 Reserved for inherently not codable concepts without codable children: Secondary | ICD-10-CM

## 2012-10-09 NOTE — Telephone Encounter (Signed)
When order for CT was placed pt had recent labs; those labs are too old considering when patient is having CT done. Therefore BMET has been ordered STAT and patient aware. She states she will come by Wednesday morning at 9:00am to have drawn. Nothing further needed at this time.

## 2012-10-10 ENCOUNTER — Other Ambulatory Visit: Payer: Medicare Other

## 2012-10-11 ENCOUNTER — Other Ambulatory Visit (INDEPENDENT_AMBULATORY_CARE_PROVIDER_SITE_OTHER): Payer: Medicare Other

## 2012-10-11 DIAGNOSIS — J8410 Pulmonary fibrosis, unspecified: Secondary | ICD-10-CM

## 2012-10-11 DIAGNOSIS — IMO0001 Reserved for inherently not codable concepts without codable children: Secondary | ICD-10-CM

## 2012-10-11 LAB — BASIC METABOLIC PANEL
BUN: 21 mg/dL (ref 6–23)
CO2: 31 mEq/L (ref 19–32)
Calcium: 9.3 mg/dL (ref 8.4–10.5)
Creatinine, Ser: 1 mg/dL (ref 0.4–1.2)
Glucose, Bld: 98 mg/dL (ref 70–99)

## 2012-10-11 NOTE — Progress Notes (Signed)
Quick Note:  Noted; lab done for CT scan. ______

## 2012-10-12 ENCOUNTER — Ambulatory Visit (INDEPENDENT_AMBULATORY_CARE_PROVIDER_SITE_OTHER)
Admission: RE | Admit: 2012-10-12 | Discharge: 2012-10-12 | Disposition: A | Discharge status: Home / Self Care | Payer: Medicare Other | Source: Ambulatory Visit | Attending: Internal Medicine | Admitting: Internal Medicine

## 2012-10-12 DIAGNOSIS — IMO0001 Reserved for inherently not codable concepts without codable children: Secondary | ICD-10-CM

## 2012-10-12 DIAGNOSIS — J8410 Pulmonary fibrosis, unspecified: Secondary | ICD-10-CM

## 2012-10-12 MED ORDER — IOHEXOL 300 MG/ML  SOLN
80.0000 mL | Freq: Once | INTRAMUSCULAR | Status: AC | PRN
  Administered 2012-10-12: 80 mL via INTRAVENOUS

## 2012-10-13 ENCOUNTER — Encounter: Payer: Self-pay | Admitting: Internal Medicine

## 2012-10-13 DIAGNOSIS — J8410 Pulmonary fibrosis, unspecified: Secondary | ICD-10-CM | POA: Insufficient documentation

## 2012-10-13 NOTE — Assessment & Plan Note (Signed)
Restrictive disease raises question of pulmonary fibrosis or sarcoid. Plan-CT scan of chest with contrast and high-resolution views. 6 minute walk test

## 2012-10-17 NOTE — Progress Notes (Signed)
Quick Note:  Pt aware of results. ______ 

## 2012-10-25 ENCOUNTER — Telehealth: Payer: Self-pay | Admitting: Internal Medicine

## 2012-10-25 NOTE — Telephone Encounter (Signed)
Called and spoke with pt and she stated that  She has been in significant pain all week but she has been trying to hold off until her ov with CY on 11/06/2012.  Pt stated that she is needing to be seen earlier or needs something for pain.  CY please advise. Thanks  Allergies  Allergen Reactions  . Codeine Rash     Last ov--10/04/2012 Next ov --11/06/2012

## 2012-10-25 NOTE — Telephone Encounter (Signed)
Called and spoke with pt and she is aware of CY recs.  She will call her primary care doctor and she is aware to keep her appt with CY in march.

## 2012-10-25 NOTE — Telephone Encounter (Signed)
Per CY-Pt should go to PCP or Urgent care for this matte.r

## 2012-11-06 ENCOUNTER — Other Ambulatory Visit: Payer: Medicare Other

## 2012-11-06 ENCOUNTER — Ambulatory Visit (INDEPENDENT_AMBULATORY_CARE_PROVIDER_SITE_OTHER): Payer: Medicare Other | Admitting: Internal Medicine

## 2012-11-06 ENCOUNTER — Encounter: Payer: Self-pay | Admitting: Internal Medicine

## 2012-11-06 ENCOUNTER — Ambulatory Visit: Payer: Medicare Other | Admitting: Internal Medicine

## 2012-11-06 VITALS — BP 140/82 | HR 76 | Ht 63.0 in | Wt 166.8 lb

## 2012-11-06 MED ORDER — HYDROCOD POLST-CHLORPHEN POLST 10-8 MG/5ML PO LQCR: 5.0000 mL | Freq: Two times a day (BID) | ORAL | Status: DC | PRN

## 2012-11-06 MED ORDER — PREDNISONE 10 MG PO TABS: ORAL_TABLET | ORAL | Status: DC

## 2012-11-06 NOTE — Progress Notes (Signed)
10/04/12- 4 yoF former smoker referred by Margaret Covey, PA. She describes shortness of breath x 6 months with no prior respiratory complaint. Pain left posterior lateral ribs for about the same interval, related to deep breath and twisting. Chronic cough with scant thick white sputum. Occasional night sweats without weight change or adenopathy. No blood or fever and nothing purulent. Has been anemic. History of hepatitis C. She has Ventolin and Symbicort used unevenly. She is unclear that they help. PFT 08/22/12- severe restriction, mild obstruction with resp to BD, DLCo severely reduced. FEV1 0.91/48%, FVC 1.12/ 41%, FEV1/FVC 0.81, FEF 25-75% 1.01/ 46%, TLC 46%, DLCO 28% CXR 11/17/11 IMPRESSION:  1. No acute cardia no pulmonary abnormalities.  2. Chronic interstitial changes, similar to previous exam.  Original Report Authenticated By: Rosealee Albee, M.D. MR chest 09/24/10 IMPRESSION:  1. 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 Although possibly from severe degenerative  arthropathy, the edema in the joint and adjacent osseous structures  raises the possibility of septic joint and osteomyelitis. No extra-  articular fluid collection is identified to provide greater  positive predictive value for septic joint. Correlate with  chronicity in onset of symptoms as well as other signs of infection  in determining the need for treatment or attempt at arthrocentesis.  2. Suspected atypical infectious bronchiolitis in the right lung  apex.  Margaret Adkins: Margaret Adkins CT chest 07/20/10 IMPRESSION: Multifocal areas of peribronchial opacities involving  the upper lobes along with diffuse bronchial wall thickening is  most compatible with bronchopneumonia.  Margaret Adkins: Margaret Adkins  11/06/12- 71 yoF former smoker referred by Margaret Covey, PA. FOLLOWS FOR: has not had test done;  Increased SOB-getting worse. Admits she has been struggling along with shortness of breath with exertion probably several months. An intermittent, somewhat pleuritic pain, left lateral chest is not new but is increased when she coughs. Notices frequent coughing spells with thick white sputum. Denies fever, blood, adenopathy, swelling, anginal pain or palpitation. PFT: 08/22/2012 severe restriction of total lung capacity, mild obstructive airways disease in small airways with response to bronchodilator, diffusion severely reduced. FVC 1.12/41%, FEV1 0.91/48%, FEV1/FVC 0.81, FEF 25-75% 1.01/46%. TLC 46%, DLCO 28%. CT chest 10/17/12 IMPRESSION:  1. Chronic interstitial changes of scarring, reticulation, and  cylindrical type bronchiectasis noted. Although there is no zonal  predominance or evidence of frank honeycombing these findings are  suspicious for early usual interstitial pneumonitis.  Original Report Authenticated By: Signa Kell, M.D. L Rib Xray 09/03/12- NEG ANA neg, Sed rate 15  ROS-see HPI Constitutional:   No-   weight loss, night sweats, fevers, chills, fatigue, lassitude. HEENT:   No-  headaches, difficulty swallowing, tooth/dental problems, sore throat,       No-  sneezing, itching, ear ache, nasal congestion, post nasal drip,  CV:  No-   chest pain, orthopnea, PND, swelling in lower extremities, anasarca,                                  dizziness, palpitations Resp: No-   shortness of breath with exertion or at rest.              No-   productive cough,  No non-productive cough,  No- coughing up of blood.              No-   change in color of  mucus.  No- wheezing.   Skin: No-   rash or lesions. GI:  No-   heartburn, indigestion,  abdominal pain, nausea, vomiting, GU: No-   dysuria, change in color of urine, no urgency or frequency.  + flank pain. MS:  +joint pain or swelling.  .  + back pain. Neuro-     nothing unusual Psych:  No- change in mood or affect. No depression or  anxiety.  No memory loss.  OBJ- Physical Exam General- Alert, Oriented, Affect-appropriate, Distress- none acute. Overweight. Skin- rash-none, lesions- none, excoriation- none Lymphadenopathy- none Head- atraumatic            Eyes- Gross vision intact, PERRLA, conjunctivae and secretions clear            Ears- Hearing, canals-normal            Nose- Clear, no-Septal dev, mucus, polyps, erosion, perforation             Throat- Mallampati II , mucosa red , drainage- none, tonsils- atrophic Neck- flexible , trachea midline, no stridor , thyroid nl, carotid no bruit Chest - symmetrical excursion , unlabored           Heart/CV- RRR , no murmur , no gallop  , no rub, nl s1 s2                           - JVD+2 cm , edema- none, stasis changes- none, varices- none           Lung- +shallow, wheeze+, cough+raspy , dullness-none, rub- none           Chest wall-  Abd-  Br/ Gen/ Rectal- Not done, not indicated Extrem- cyanosis- none, clubbing, none, atrophy- none, strength- nl Neuro- grossly intact to observation

## 2012-11-06 NOTE — Patient Instructions (Addendum)
Script sent for prednisone to take for 20 days as directed, then stop. It will run your sugar up while you are on it. We want to see if it helps your breathing at all.  Order- schedule ONOX on room air              Dx pulmonary fibrosis             Schedule 6 MWT  Order- lab- sputum C&S-  Routine, fungal and AFB   Dx pulmonary fibrosis

## 2012-11-07 ENCOUNTER — Other Ambulatory Visit: Payer: Self-pay | Admitting: Medical

## 2012-11-07 ENCOUNTER — Encounter: Payer: Self-pay | Admitting: Internal Medicine

## 2012-11-07 NOTE — Assessment & Plan Note (Signed)
Local heat / analgesics. Report progression

## 2012-11-07 NOTE — Assessment & Plan Note (Signed)
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 Plan-sputum cultures including AFB, overnight oximetry, 6 minute walk test. Trial of prednisone 20 mg daily x10 days 10 mg daily x10 days then stop. Watch for any suggestion of impact on dyspnea.

## 2012-11-09 ENCOUNTER — Other Ambulatory Visit: Payer: Self-pay | Admitting: Internal Medicine

## 2012-11-15 ENCOUNTER — Telehealth: Payer: Self-pay | Admitting: Internal Medicine

## 2012-11-15 NOTE — Telephone Encounter (Signed)
I reviewed lab/ CT and ONOX results with pt's daughter Vikki Ports. Tentative dx UIP. Dtr says pt has been coughing for a long time. I explained this is a progressive condition with no effective treatment currently available. By her ONOX she doesn't yet qualify for O2 during sleep.  Lab didn't get sputum cultures started for AFB or Fungal. I will ask for those and for a barium swallow looking for reflux/ aspiration.

## 2012-11-15 NOTE — Telephone Encounter (Signed)
CY is aware of this matter and requests that the phone note be sent to him.

## 2012-11-16 NOTE — Addendum Note (Signed)
Addended by: Reynaldo Minium C on: 11/16/2012 10:38 AM   Modules accepted: Orders

## 2012-11-21 ENCOUNTER — Ambulatory Visit (HOSPITAL_COMMUNITY)
Admission: RE | Admit: 2012-11-21 | Discharge: 2012-11-21 | Disposition: A | Discharge status: Home / Self Care | Payer: Medicare Other | Source: Ambulatory Visit | Attending: Internal Medicine | Admitting: Internal Medicine

## 2012-11-21 ENCOUNTER — Other Ambulatory Visit: Payer: Self-pay

## 2012-11-21 DIAGNOSIS — R059 Cough, unspecified: Secondary | ICD-10-CM | POA: Insufficient documentation

## 2012-11-21 DIAGNOSIS — M47812 Spondylosis without myelopathy or radiculopathy, cervical region: Secondary | ICD-10-CM | POA: Insufficient documentation

## 2012-11-21 DIAGNOSIS — K224 Dyskinesia of esophagus: Secondary | ICD-10-CM | POA: Insufficient documentation

## 2012-11-26 ENCOUNTER — Other Ambulatory Visit: Payer: Medicare Other

## 2012-11-26 ENCOUNTER — Telehealth: Payer: Self-pay | Admitting: Internal Medicine

## 2012-11-26 NOTE — Telephone Encounter (Signed)
Pt was suppose to go to lab to get sputum cup to leave sample for AFB and Fungal culture and smear. Pt went to lab and lab aware orders still on EPIC from 11-07-12.

## 2012-11-27 ENCOUNTER — Encounter: Payer: Self-pay | Admitting: Internal Medicine

## 2012-11-27 ENCOUNTER — Other Ambulatory Visit: Payer: Medicare Other

## 2012-11-27 DIAGNOSIS — J8410 Pulmonary fibrosis, unspecified: Secondary | ICD-10-CM

## 2012-11-29 ENCOUNTER — Encounter: Payer: Self-pay | Admitting: Internal Medicine

## 2012-11-30 ENCOUNTER — Encounter: Payer: Self-pay | Admitting: Medical

## 2012-11-30 ENCOUNTER — Ambulatory Visit
Admission: RE | Admit: 2012-11-30 | Discharge: 2012-11-30 | Disposition: A | Discharge status: Home / Self Care | Payer: Medicare Other | Source: Ambulatory Visit | Attending: Medical | Admitting: Medical

## 2012-11-30 ENCOUNTER — Ambulatory Visit (INDEPENDENT_AMBULATORY_CARE_PROVIDER_SITE_OTHER): Payer: Medicare Other | Admitting: Medical

## 2012-11-30 ENCOUNTER — Other Ambulatory Visit: Payer: Self-pay | Admitting: Medical

## 2012-11-30 VITALS — BP 120/70 | HR 92 | Temp 97.6°F | Resp 18 | Wt 174.0 lb

## 2012-11-30 DIAGNOSIS — IMO0001 Reserved for inherently not codable concepts without codable children: Secondary | ICD-10-CM

## 2012-11-30 DIAGNOSIS — R05 Cough: Secondary | ICD-10-CM

## 2012-11-30 DIAGNOSIS — R079 Chest pain, unspecified: Secondary | ICD-10-CM

## 2012-11-30 DIAGNOSIS — R109 Unspecified abdominal pain: Secondary | ICD-10-CM

## 2012-11-30 DIAGNOSIS — J8410 Pulmonary fibrosis, unspecified: Secondary | ICD-10-CM

## 2012-11-30 LAB — POCT URINALYSIS DIPSTICK
Bilirubin, UA: NEGATIVE
Blood, UA: NEGATIVE
Glucose, UA: NEGATIVE
Leukocytes, UA: NEGATIVE
Nitrite, UA: NEGATIVE
Urobilinogen, UA: NEGATIVE

## 2012-11-30 LAB — CBC WITH DIFFERENTIAL/PLATELET
HCT: 38.1 % (ref 36.0–46.0)
Lymphocytes Relative: 36 % (ref 12–46)
MCHC: 32.1 g/dL (ref 30.0–36.0)
MCV: 93.4 fL (ref 78.0–100.0)
Monocytes Absolute: 0.1 10*3/uL (ref 0.1–1.0)
Platelets: 139 10*3/uL — ABNORMAL LOW (ref 150–400)
RDW: 13.9 % (ref 11.5–15.5)
WBC: 6.9 10*3/uL (ref 4.0–10.5)

## 2012-11-30 LAB — BASIC METABOLIC PANEL
BUN: 21 mg/dL (ref 6–23)
CO2: 26 mEq/L (ref 19–32)
Glucose, Bld: 119 mg/dL — ABNORMAL HIGH (ref 70–99)
Potassium: 4.1 mEq/L (ref 3.5–5.3)
Sodium: 144 mEq/L (ref 135–145)

## 2012-11-30 MED ORDER — AZITHROMYCIN 250 MG PO TABS: ORAL_TABLET | ORAL | Status: DC

## 2012-11-30 NOTE — Progress Notes (Signed)
Subjective:  Margaret Adkins is a 72 y.o. female who presents with few day hx/o severe pain in her right side.  She notes pain localized to right side around lower ribs upper abdomen area.  Pain doesn't radiates.  She says it feels like the same pain she had months ago with pneumonia.  She has ongoing chronic cough for months now, has seen pulmonology several times recently and has diagnosis of interstitial fibrosis.  She just finished a round of prednisone.  She notes chills, flushing, but no productive cough, no fever, no abdominal pain, no nausea, vomiting, diarrhea.  She is urinating a little more than usual but no burning with urination, hematuria, back pain or abdominal pressure.  No recent fall or injury.   No blood in stool or changes in bowel movement.  No recent long travel, calve pain, no recent surgery.  No chest pain, palpitations, or edema. No other aggravating or relieving factors.    No other c/o.  The following portions of the patient's history were reviewed and updated as appropriate: allergies, current medications, past family history, past medical history, past social history, past surgical history and problem list.  ROS Otherwise as in subjective above  Objective: Physical Exam  Vital signs reviewed  General appearance: wd, wn, in pain, holding her right side HEENT: normocephalic, sclerae anicteric, conjunctiva pink and moist, TMs pearly, nares patent, no discharge or erythema, pharynx normal Oral cavity: MMM, no lesions Neck: supple, no lymphadenopathy, no thyromegaly, no masses Heart: RRR, normal S1, S2, no murmurs Lungs: decrease breath sounds in general, few rhonchi, no wheezes, no rales Chest wall: tender right lower ribs and chest wall laterally Back: nontender, but pain with lying down and raising up suggesting musculoskeletal pain Abdomen: +bs, soft, tender lateral upper abdomen, but -[place], otherwise non tender, non distended, no masses, no hepatomegaly, no  splenomegaly Pulses: 2+ radial pulses, 2+ pedal pulses, normal cap refill Ext: no edema   Assessment: Encounter Diagnoses  Name Primary?  . Flank pain Yes  . Chest pain   . Cough   . Interstitial fibrosis     Plan: Urinalysis unremarkable.   Etiology not clear.  Musculoskeletal vs pneumonia/bronchitis vs urinary tract infection vs other.  Will send for CXR, labs.   wall call with labs rests and plan.

## 2012-12-01 ENCOUNTER — Encounter: Payer: Self-pay | Admitting: Medical

## 2012-12-04 ENCOUNTER — Telehealth: Payer: Self-pay | Admitting: Family Medicine

## 2012-12-04 NOTE — Telephone Encounter (Signed)
Patient wants to know if she can get a refill on the medication you gave her. She said she is still having pain on her side. CLS

## 2012-12-04 NOTE — Telephone Encounter (Signed)
pls call and find out what her symptoms are currently?  Which medication is she referring to?

## 2012-12-05 ENCOUNTER — Other Ambulatory Visit: Payer: Self-pay | Admitting: Medical

## 2012-12-05 MED ORDER — HYDROCODONE-ACETAMINOPHEN 5-325 MG PO TABS: ORAL_TABLET | ORAL | Status: DC

## 2012-12-05 NOTE — Telephone Encounter (Signed)
Patient states that she is having the same pains but the pain level is at 5. She said the medication she was talking to was the Azithromycin and the hydrocodone. CLS

## 2012-12-06 NOTE — Progress Notes (Signed)
Called out med and left message for pt to call me back

## 2012-12-10 ENCOUNTER — Other Ambulatory Visit: Payer: Self-pay | Admitting: Family Medicine

## 2012-12-10 ENCOUNTER — Telehealth: Payer: Self-pay | Admitting: Family Medicine

## 2012-12-10 NOTE — Telephone Encounter (Signed)
Patient states that she is having the same pains but the pain level is at 5. She said the medication she was talking to was the Azithromycin and the hydrocodone. CLS          Message was taken care of. CLS

## 2012-12-13 ENCOUNTER — Ambulatory Visit (INDEPENDENT_AMBULATORY_CARE_PROVIDER_SITE_OTHER): Payer: Medicare Other | Admitting: Internal Medicine

## 2012-12-13 ENCOUNTER — Encounter: Payer: Self-pay | Admitting: Internal Medicine

## 2012-12-13 VITALS — BP 130/78 | HR 99 | Ht 63.0 in | Wt 168.0 lb

## 2012-12-13 DIAGNOSIS — J984 Other disorders of lung: Secondary | ICD-10-CM

## 2012-12-13 DIAGNOSIS — R0609 Other forms of dyspnea: Secondary | ICD-10-CM

## 2012-12-13 DIAGNOSIS — R0600 Dyspnea, unspecified: Secondary | ICD-10-CM

## 2012-12-13 MED ORDER — HYDROCOD POLST-CHLORPHEN POLST 10-8 MG/5ML PO LQCR: 5.0000 mL | Freq: Two times a day (BID) | ORAL | Status: DC | PRN

## 2012-12-13 NOTE — Patient Instructions (Addendum)
Use the blue Ventolin albuterol HFA inhaler as your rescue medicine. This is the one to carry around, and use 2 puffs, up to 4 times daily if needed.  The red Symbicort inhaler is a maintenance medicine, used 2 puffs then rinse mouth, twice daily every day. This one you just leave on your bathroom sink- don't carry it around. You can use it before you brush teeth.  Try using up the Symbicort that you have and see if you notice any real improvement with steady use. If you don't, then just drop off of it.   Script refill for Tussionex.

## 2012-12-13 NOTE — Progress Notes (Signed)
10/04/12- 59 yoF former smoker referred by Kristian Covey, PA. She describes shortness of breath x 6 months with no prior respiratory complaint. Pain left posterior lateral ribs for about the same interval, related to deep breath and twisting. Chronic cough with scant thick white sputum. Occasional night sweats without weight change or adenopathy. No blood or fever and nothing purulent. Has been anemic. History of hepatitis C. She has Ventolin and Symbicort used unevenly. She is unclear that they help. PFT 08/22/12- severe restriction, mild obstruction with resp to BD, DLCo severely reduced. FEV1 0.91/48%, FVC 1.12/ 41%, FEV1/FVC 0.81, FEF 25-75% 1.01/ 46%, TLC 46%, DLCO 28% CXR 11/17/11 IMPRESSION:  1. No acute cardia no pulmonary abnormalities.  2. Chronic interstitial changes, similar to previous exam.  Original Report Authenticated By: Rosealee Albee, M.D. MR chest 09/24/10 IMPRESSION:  1. 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 Although possibly from severe degenerative  arthropathy, the edema in the joint and adjacent osseous structures  raises the possibility of septic joint and osteomyelitis. No extra-  articular fluid collection is identified to provide greater  positive predictive value for septic joint. Correlate with  chronicity in onset of symptoms as well as other signs of infection  in determining the need for treatment or attempt at arthrocentesis.  2. Suspected atypical infectious bronchiolitis in the right lung  apex.  Provider: [name] CT chest 07/20/10 IMPRESSION: Multifocal areas of peribronchial opacities involving  the upper lobes along with diffuse bronchial wall thickening is  most compatible with bronchopneumonia.  Provider: [name]  11/06/12- 71 yoF former smoker referred by Kristian Covey, PA. FOLLOWS FOR: has not had test done;  Increased SOB-getting worse. Admits she has been struggling along with shortness of breath with exertion probably several months. An intermittent, somewhat pleuritic pain, left lateral chest is not new but is increased when she coughs. Notices frequent coughing spells with thick white sputum. Denies fever, blood, adenopathy, swelling, anginal pain or palpitation. PFT: 08/22/2012 severe restriction of total lung capacity, mild obstructive airways disease in small airways with response to bronchodilator, diffusion severely reduced. FVC 1.12/41%, FEV1 0.91/48%, FEV1/FVC 0.81, FEF 25-75% 1.01/46%. TLC 46%, DLCO 28%. CT chest 10/17/12 IMPRESSION:  1. Chronic interstitial changes of scarring, reticulation, and  cylindrical type bronchiectasis noted. Although there is no zonal  predominance or evidence of frank honeycombing these findings are  suspicious for early usual interstitial pneumonitis.  Original Report Authenticated By: Signa Kell, M.D. L Rib Xray 09/03/12- NEG ANA neg, Sed rate 15  12/13/12-71 yoF former smoker followed for Interstitial lung disease/ fibrosis.   PCP Kristian Covey, PA. FOLLOWS FOR: review ONO and test results with patient.  Pt states breathing is better since last OV. Pt would like to have Rx for Tussionex. Still coughing. Denies seasonal allergy. Sputum cultures from 11/06/2012 all negative so far with normal flora. Overnight oximetry 11/07/2012- adequate, not qualify for O2 during sleep. - 12/13/12- 94%, 79%, 96%, 447 m. Desaturated during exertion. Good recovery.  ROS-see HPI Constitutional:   No-   weight loss, night sweats, fevers, chills, fatigue, lassitude. HEENT:   No-  headaches, difficulty swallowing, tooth/dental problems, sore throat,       No-  sneezing, itching, ear ache, nasal congestion, post nasal drip,  CV:  No-   chest pain, orthopnea, PND, swelling in lower extremities, anasarca,  dizziness, palpitations Resp: +  shortness of breath with exertion or at rest.              No-   productive cough,  + non-productive cough,  No- coughing up of blood.              No-   change in color of mucus.  No- wheezing.   Skin: No-   rash or lesions. GI:  No-   heartburn, indigestion,  abdominal pain, nausea, vomiting, GU:  MS:  +joint pain or swelling.  .  + back pain. Neuro-     nothing unusual Psych:  No- change in mood or affect. No depression or anxiety.  No memory loss.  OBJ- Physical Exam General- Alert, Oriented, Affect-appropriate, Distress- none acute. Overweight. Skin- rash-none, lesions- none, excoriation- none Lymphadenopathy- none Head- atraumatic            Eyes- Gross vision intact, PERRLA, conjunctivae and secretions clear            Ears- Hearing, canals-normal            Nose- Clear, no-Septal dev, mucus, polyps, erosion, perforation             Throat- Mallampati II , mucosa red , drainage- none, tonsils- atrophic Neck- flexible , trachea midline, no stridor , thyroid nl, carotid no bruit Chest - symmetrical excursion , unlabored           Heart/CV- RRR , no murmur , no gallop  , no rub, nl s1 s2                           - JVD+1 cm , edema- none, stasis changes- none, varices- none           Lung- +shallow, wheeze+, cough+dry , dullness-none, rub- none. No crackles           Chest wall-  Abd-  Br/ Gen/ Rectal- Not done, not indicated Extrem- cyanosis- none, clubbing, none, atrophy- none, strength- nl Neuro- grossly intact to observation

## 2012-12-22 NOTE — Progress Notes (Signed)
Documentation for 6 minute walk test 

## 2012-12-22 NOTE — Assessment & Plan Note (Signed)
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 Plan-Will pace herself during exertion and we will watch the oxygen qualification issue. Refill Tussionex. She will use up her Symbicort and then stopped to see if it makes any real difference.

## 2012-12-24 LAB — FUNGUS CULTURE W SMEAR: Smear Result: NONE SEEN

## 2013-01-01 ENCOUNTER — Ambulatory Visit
Admission: RE | Admit: 2013-01-01 | Discharge: 2013-01-01 | Disposition: A | Discharge status: Home / Self Care | Payer: Medicare Other | Source: Ambulatory Visit

## 2013-01-01 DIAGNOSIS — Z1231 Encounter for screening mammogram for malignant neoplasm of breast: Secondary | ICD-10-CM

## 2013-01-03 NOTE — Progress Notes (Signed)
Quick Note:  Pt aware of results. ______ 

## 2013-01-21 ENCOUNTER — Encounter: Payer: Self-pay | Admitting: Medical

## 2013-01-21 ENCOUNTER — Ambulatory Visit (INDEPENDENT_AMBULATORY_CARE_PROVIDER_SITE_OTHER): Payer: Medicare Other | Admitting: Medical

## 2013-01-21 ENCOUNTER — Other Ambulatory Visit: Payer: Self-pay | Admitting: Medical

## 2013-01-21 VITALS — BP 112/78 | HR 68 | Temp 98.0°F | Resp 16 | Wt 170.0 lb

## 2013-01-21 DIAGNOSIS — K219 Gastro-esophageal reflux disease without esophagitis: Secondary | ICD-10-CM

## 2013-01-21 DIAGNOSIS — E785 Hyperlipidemia, unspecified: Secondary | ICD-10-CM

## 2013-01-21 DIAGNOSIS — E119 Type 2 diabetes mellitus without complications: Secondary | ICD-10-CM

## 2013-01-21 DIAGNOSIS — Z8619 Personal history of other infectious and parasitic diseases: Secondary | ICD-10-CM

## 2013-01-21 DIAGNOSIS — R109 Unspecified abdominal pain: Secondary | ICD-10-CM

## 2013-01-21 DIAGNOSIS — IMO0001 Reserved for inherently not codable concepts without codable children: Secondary | ICD-10-CM

## 2013-01-21 DIAGNOSIS — N289 Disorder of kidney and ureter, unspecified: Secondary | ICD-10-CM

## 2013-01-21 DIAGNOSIS — I10 Essential (primary) hypertension: Secondary | ICD-10-CM

## 2013-01-21 DIAGNOSIS — J8410 Pulmonary fibrosis, unspecified: Secondary | ICD-10-CM

## 2013-01-21 LAB — CBC WITH DIFFERENTIAL/PLATELET
Basophils Absolute: 0 10*3/uL (ref 0.0–0.1)
Basophils Relative: 0 % (ref 0–1)
Eosinophils Absolute: 0.1 10*3/uL (ref 0.0–0.7)
MCH: 29.2 pg (ref 26.0–34.0)
MCHC: 32.5 g/dL (ref 30.0–36.0)
Monocytes Relative: 6 % (ref 3–12)
Neutrophils Relative %: 44 % (ref 43–77)
Platelets: 204 10*3/uL (ref 150–400)
RDW: 13.2 % (ref 11.5–15.5)

## 2013-01-21 NOTE — Progress Notes (Signed)
Subjective:    Margaret Adkins is a 72 y.o. female who presents for follow-up of Type 2 diabetes mellitus.    Home blood sugar records: fasting range: 90-110s  Current symptoms/problems include polyuria and have been worsening. Daily foot checks, foot concerns: none Last eye exam:  Just recently, is on drops currently per eye doctor   Medication compliance: Current diet: in general, a "healthy" diet   Current exercise: walks some Known diabetic complications: none Cardiovascular risk factors: advanced age (older than 28 for men, 104 for women), dyslipidemia and hypertension  She notes right lower abdomen pain like a pulling, been going a lot lately.  Worse since last visit.  Worse mostly with cough. Denies a bulge. Not worse with eating.  No NVD.  Makes her urinate more.   Can't recall last scan of abdomen.  HTN - compliant with medication without c/o  Hyperlipidemia - compliant with medication without c/o  The following portions of the patient's history were reviewed and updated as appropriate: allergies, current medications, past family history, past medical history, past social history, past surgical history and problem list.  ROS as in subjective above    Objective:    BP 112/78  Pulse 68  Temp(Src) 98 F (36.7 C) (Oral)  Resp 16  Wt 170 lb (77.111 kg)  BMI 30.12 kg/m2  Filed Vitals:   01/21/13 1554  BP: 112/78  Pulse: 68  Temp: 98 F (36.7 C)  Resp: 16    General appearance: alert, no distress, WD/WN Neck: supple, no lymphadenopathy, no thyromegaly, no masses Heart: RRR, normal S1, S2, no murmurs Lungs: CTA bilaterally, no wheezes, rhonchi, or rales Abdomen: +bs, soft, obese, central vertical surgical scar, tender right mid to lower abdomen, non distended, no masses, no hepatomegaly, no splenomegaly Pulses: 2+ symmetric, upper and lower extremities, normal cap refill Ext: no edema   Lab Review Lab Results  Component Value Date   HGBA1C 6.5* 08/17/2012    Lab Results  Component Value Date   CHOL 167 08/17/2012   HDL 72 08/17/2012   LDLCALC 78 08/17/2012   TRIG 84 08/17/2012   CHOLHDL 2.3 08/17/2012   Lab Results  Component Value Date   MICROALBUR 1.46 08/17/2012     Chemistry      Component Value Date/Time   NA 144 11/30/2012 1201   K 4.1 11/30/2012 1201   CL 106 11/30/2012 1201   CO2 26 11/30/2012 1201   BUN 21 11/30/2012 1201   CREATININE 1.20* 11/30/2012 1201   CREATININE 1.0 10/11/2012 0904      Component Value Date/Time   CALCIUM 9.3 11/30/2012 1201   ALKPHOS 77 09/03/2012 1520   AST 36 09/03/2012 1520   ALT 29 09/03/2012 1520   BILITOT 0.3 09/03/2012 1520        Chemistry      Component Value Date/Time   NA 144 11/30/2012 1201   K 4.1 11/30/2012 1201   CL 106 11/30/2012 1201   CO2 26 11/30/2012 1201   BUN 21 11/30/2012 1201   CREATININE 1.20* 11/30/2012 1201   CREATININE 1.0 10/11/2012 0904      Component Value Date/Time   CALCIUM 9.3 11/30/2012 1201   ALKPHOS 77 09/03/2012 1520   AST 36 09/03/2012 1520   ALT 29 09/03/2012 1520   BILITOT 0.3 09/03/2012 1520       Assessment:   Encounter Diagnoses  Name Primary?  . Abdominal pain Yes  . Type II or unspecified type diabetes mellitus without mention  of complication, not stated as uncontrolled   . Essential hypertension, benign   . Renal insufficiency   . Hyperlipidemia   . Interstitial fibrosis   . GERD (gastroesophageal reflux disease)   . History of hepatitis C       Plan:    Labs today, will set up CT abdomen pelvis given worsening right sided abdominal pain.  Question hernia vs diverticulitis vs mass or other.  Will give her instructions about holding her metformin.     DM type II - labs today, she has been controlled  HTN - controlled, c/t same medication  Renal insufficiency - labs today  Hyperlipoidemia - well controlled, repeat labs in 17mo  interstitial fibrosis of lungs - followed by pulmonology now, new diagnosis this past year  GERD -  controlled on Prilosec  Hep C - hx/o interferon therapy.  No recent viral load.   Will hold off on this today, given her other concerns.

## 2013-01-22 LAB — BASIC METABOLIC PANEL
CO2: 30 mEq/L (ref 19–32)
Chloride: 99 mEq/L (ref 96–112)
Glucose, Bld: 96 mg/dL (ref 70–99)
Sodium: 139 mEq/L (ref 135–145)

## 2013-01-22 LAB — HEPATIC FUNCTION PANEL
ALT: 22 U/L (ref 0–35)
AST: 30 U/L (ref 0–37)
Alkaline Phosphatase: 59 U/L (ref 39–117)
Indirect Bilirubin: 0.4 mg/dL (ref 0.0–0.9)
Total Protein: 7.4 g/dL (ref 6.0–8.3)

## 2013-01-22 LAB — HEMOGLOBIN A1C: Hgb A1c MFr Bld: 5.6 % (ref <5.7)

## 2013-01-22 NOTE — Addendum Note (Signed)
Addended by: Janeice Robinson on: 01/22/2013 08:33 AM   Modules accepted: Orders

## 2013-01-22 NOTE — Addendum Note (Signed)
Addended by: Janeice Robinson on: 01/22/2013 08:42 AM   Modules accepted: Orders

## 2013-01-23 ENCOUNTER — Other Ambulatory Visit: Payer: Self-pay | Admitting: Medical

## 2013-01-23 ENCOUNTER — Ambulatory Visit
Admission: RE | Admit: 2013-01-23 | Discharge: 2013-01-23 | Disposition: A | Discharge status: Home / Self Care | Payer: Medicare Other | Source: Ambulatory Visit | Attending: Medical | Admitting: Medical

## 2013-01-23 DIAGNOSIS — B1920 Unspecified viral hepatitis C without hepatic coma: Secondary | ICD-10-CM

## 2013-01-23 DIAGNOSIS — Z8619 Personal history of other infectious and parasitic diseases: Secondary | ICD-10-CM

## 2013-01-23 DIAGNOSIS — R109 Unspecified abdominal pain: Secondary | ICD-10-CM

## 2013-01-23 LAB — HEPATITIS C RNA QUANTITATIVE: HCV Quantitative Log: 6.47 {Log} — ABNORMAL HIGH (ref <1.18)

## 2013-01-23 MED ORDER — IOHEXOL 300 MG/ML  SOLN
100.0000 mL | Freq: Once | INTRAMUSCULAR | Status: AC | PRN
  Administered 2013-01-23: 100 mL via INTRAVENOUS

## 2013-01-24 ENCOUNTER — Other Ambulatory Visit: Payer: Self-pay

## 2013-01-24 DIAGNOSIS — B182 Chronic viral hepatitis C: Secondary | ICD-10-CM

## 2013-03-19 IMAGING — CT CT CHEST W/ CM
2 of 6 series · 15 of 36 positions shown, 18 images · IV contrast (Omnipaque 300)
Comparison: 07/20/2010

CLINICAL DATA: Evaluate for interstitial fibrosis

CT CHEST WITH CONTRAST
TECHNIQUE: Multidetector CT imaging of the chest was performed
following the standard protocol during bolus administration of
intravenous contrast.
Contrast: 80mL OMNIPAQUE IOHEXOL 300 MG/ML  SOLN

[Series 2: chest routine with · axial · 0.57mm/px · z∈[-230,-36]mm · 12 of 47 slices shown, 15 images]
[im 4/47  mediastinal]
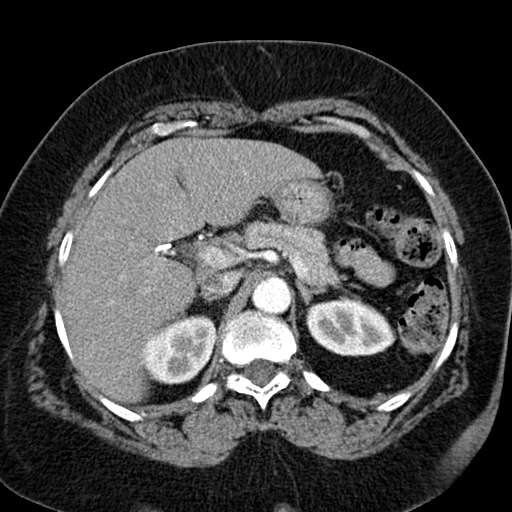
[im 4/47  lung]
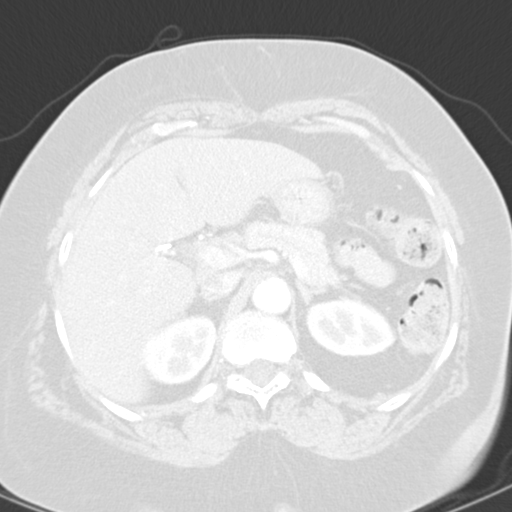
[im 7/47  lung]
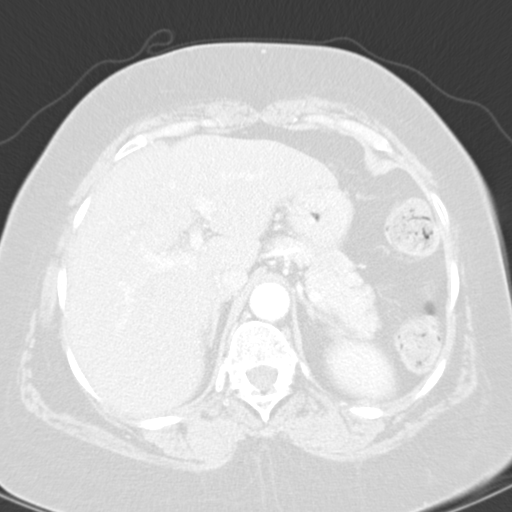
[im 10/47  lung]
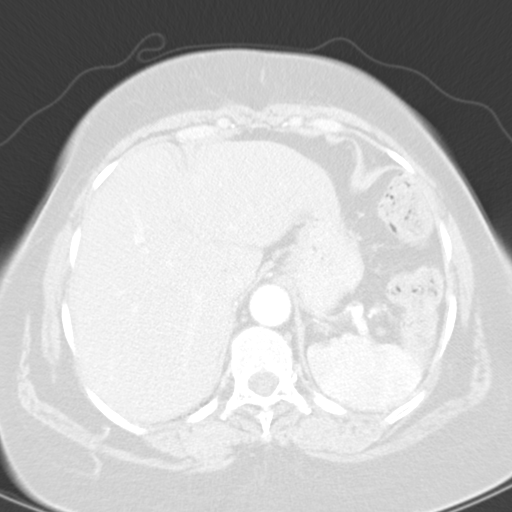
[im 14/47  lung]
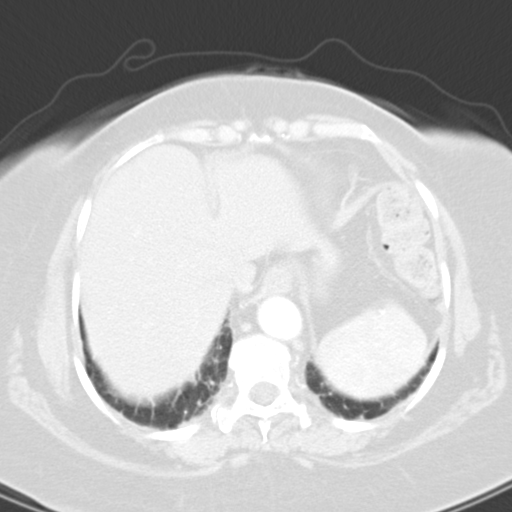
[im 17/47  mediastinal]
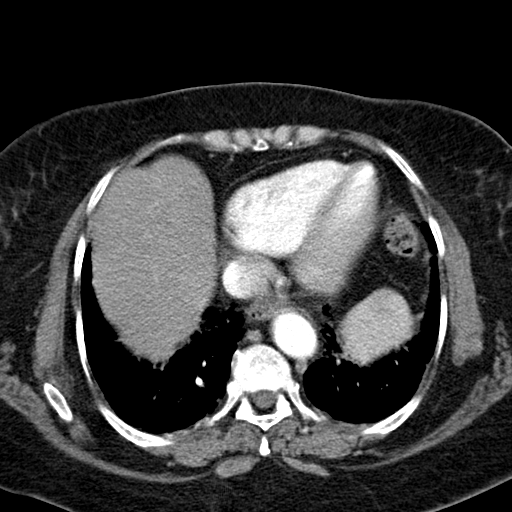
[im 17/47  lung]
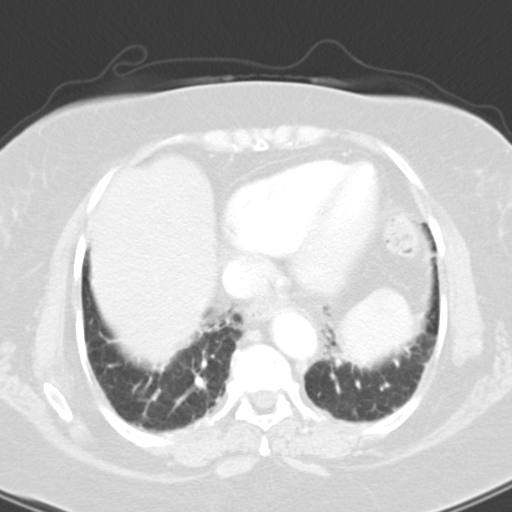
[im 20/47  lung]
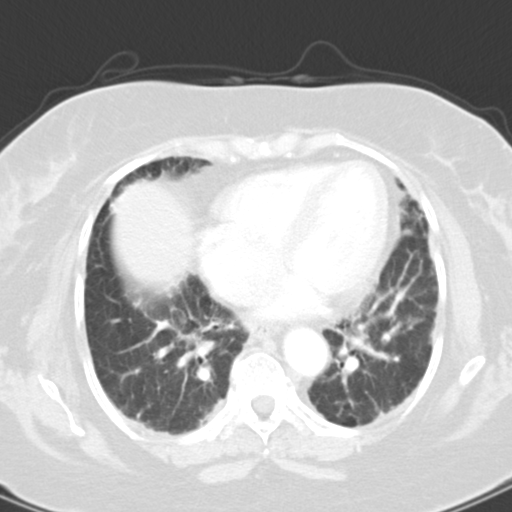
[im 27/47  lung]
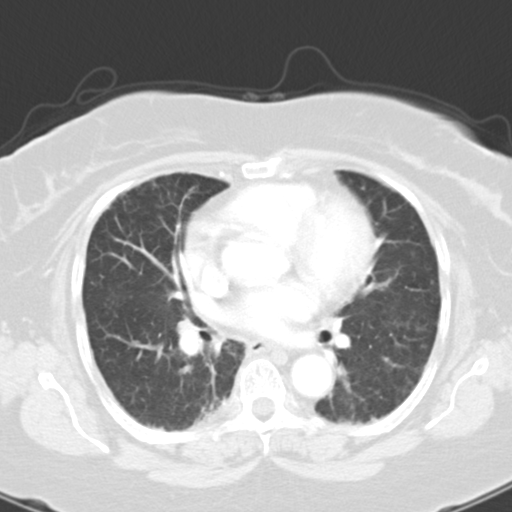
[im 30/47  lung]
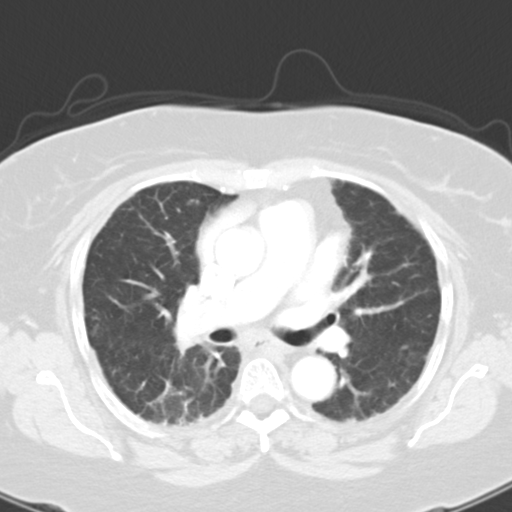
[im 33/47  mediastinal]
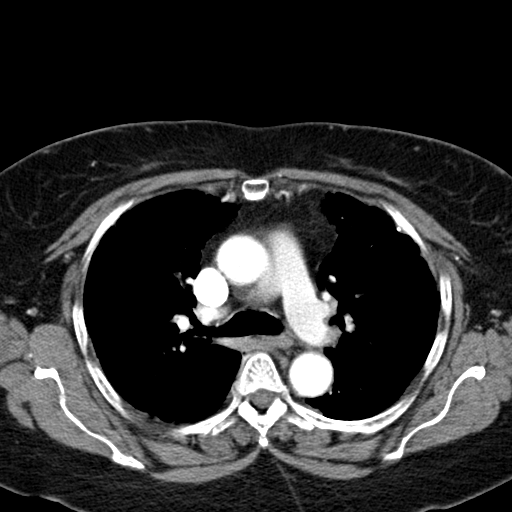
[im 33/47  lung]
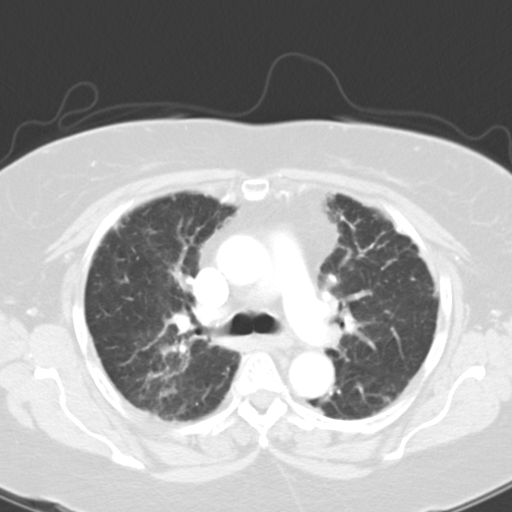
[im 37/47  lung]
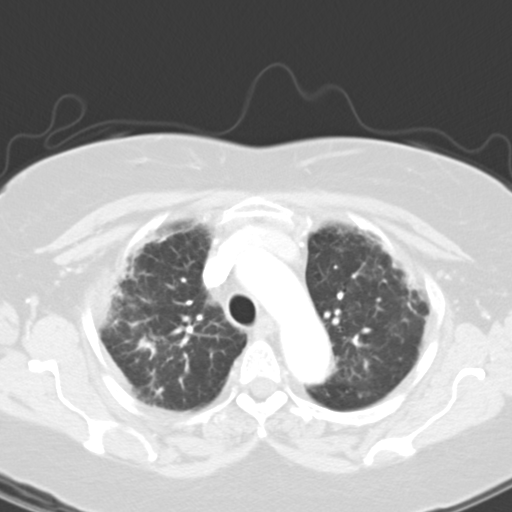
[im 40/47  lung]
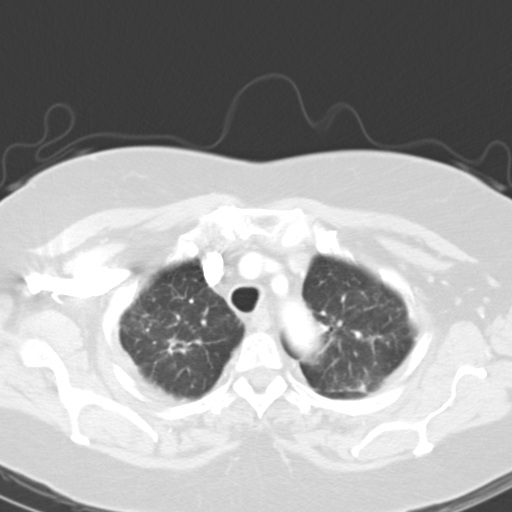
[im 43/47  lung]
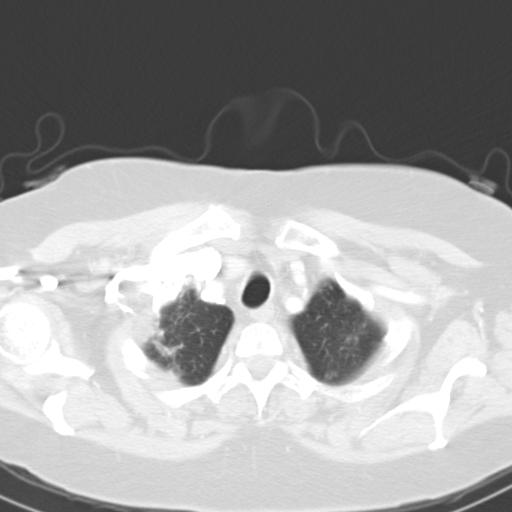

[Series 602: cor · coronal · 0.57mm/px · 3 of 86 slices shown]
[im 18/86  lung]
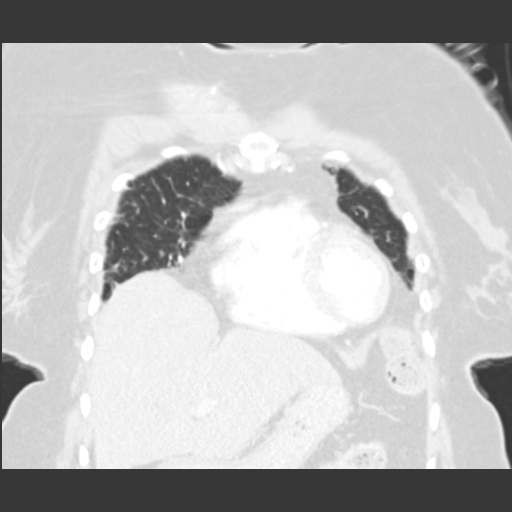
[im 35/86  lung]
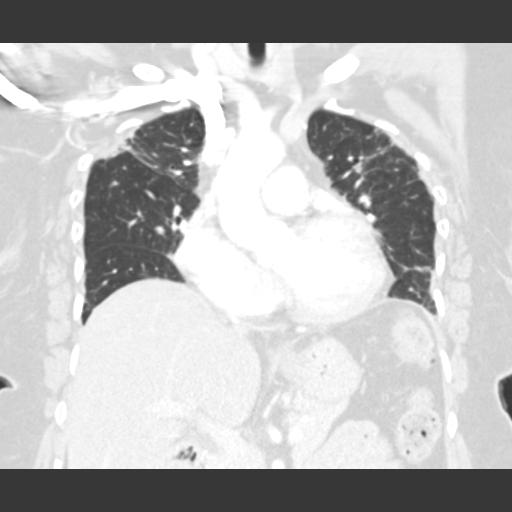
[im 52/86  lung]
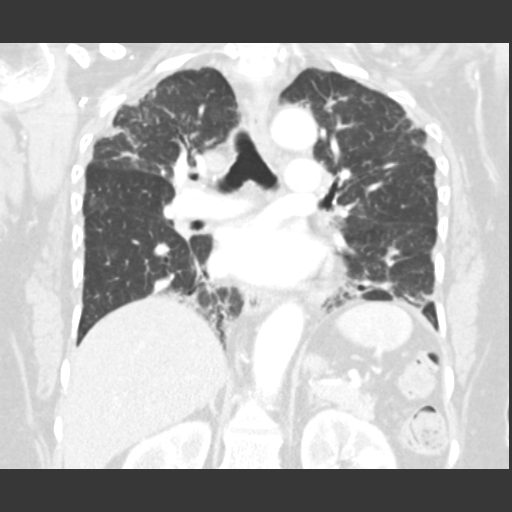

[15 of 36 positions shown; findings below may reference images not displayed]

FINDINGS: Lungs/pleura: No pleural effusion.  There is bilateral
peripheral predominant interstitial reticulation.  There is
traction bronchiectasis are noted as well as scarring.  Focal area
of consolidation is identified within the basilar portion of the
right upper lobe, image 16.  No diagnostic areas of honeycombing
identified.  Compared with CT from 07/20/2010 these findings
demonstrate significant interval progression and are suspicious for
early usual interstitial pneumonitis.

Heart/Mediastinum: There is mild cardiac enlargement.  No enlarged
mediastinal or hilar adenopathy.  Right paratracheal lymph node
measures 6 mm, image 13.

Upper abdomen: Changes from prior cholecystectomy noted.

Bones/Musculoskeletal:  Review of the visualized bony structures is
unremarkable.  No worrisome lytic or sclerotic bone lesions.
IMPRESSION: 1.  Chronic interstitial changes of scarring, reticulation, and
cylindrical type bronchiectasis noted.  Although there is no zonal
predominance or evidence of frank honeycombing these findings are
suspicious for early usual interstitial pneumonitis.

## 2013-04-15 ENCOUNTER — Ambulatory Visit: Payer: Medicare Other | Admitting: Internal Medicine

## 2013-04-26 ENCOUNTER — Telehealth: Payer: Self-pay | Admitting: Internal Medicine

## 2013-04-26 NOTE — Telephone Encounter (Signed)
I spoke with pt. She is going to drop off CD ROM for CDY from her recent hospitalizations. She scheduled next available appt. She needed nothing further

## 2013-04-29 ENCOUNTER — Ambulatory Visit: Payer: Medicare Other | Admitting: Internal Medicine

## 2013-05-13 ENCOUNTER — Ambulatory Visit (INDEPENDENT_AMBULATORY_CARE_PROVIDER_SITE_OTHER): Payer: Medicare Other | Admitting: Medical

## 2013-05-13 ENCOUNTER — Encounter: Payer: Self-pay | Admitting: Medical

## 2013-05-13 VITALS — BP 140/78 | HR 76 | Temp 97.9°F | Resp 18 | Wt 161.0 lb

## 2013-05-13 DIAGNOSIS — I10 Essential (primary) hypertension: Secondary | ICD-10-CM

## 2013-05-13 DIAGNOSIS — E785 Hyperlipidemia, unspecified: Secondary | ICD-10-CM

## 2013-05-13 DIAGNOSIS — E119 Type 2 diabetes mellitus without complications: Secondary | ICD-10-CM

## 2013-05-13 DIAGNOSIS — B182 Chronic viral hepatitis C: Secondary | ICD-10-CM

## 2013-05-13 DIAGNOSIS — J8410 Pulmonary fibrosis, unspecified: Secondary | ICD-10-CM

## 2013-05-13 DIAGNOSIS — J209 Acute bronchitis, unspecified: Secondary | ICD-10-CM

## 2013-05-13 DIAGNOSIS — R0902 Hypoxemia: Secondary | ICD-10-CM

## 2013-05-13 MED ORDER — OMEPRAZOLE 40 MG PO CPDR: 40.0000 mg | DELAYED_RELEASE_CAPSULE | Freq: Every day | ORAL | Status: DC

## 2013-05-13 MED ORDER — PIOGLITAZONE HCL-METFORMIN HCL 15-500 MG PO TABS: 1.0000 | ORAL_TABLET | Freq: Two times a day (BID) | ORAL | Status: DC

## 2013-05-13 MED ORDER — PRAVASTATIN SODIUM 40 MG PO TABS: 40.0000 mg | ORAL_TABLET | Freq: Every day | ORAL | Status: DC

## 2013-05-13 MED ORDER — ALBUTEROL SULFATE HFA 108 (90 BASE) MCG/ACT IN AERS: 2.0000 | INHALATION_SPRAY | Freq: Four times a day (QID) | RESPIRATORY_TRACT | Status: DC | PRN

## 2013-05-13 MED ORDER — OLMESARTAN MEDOXOMIL-HCTZ 40-25 MG PO TABS: 1.0000 | ORAL_TABLET | Freq: Every day | ORAL | Status: DC

## 2013-05-13 NOTE — Progress Notes (Signed)
  Subjective:    Margaret Adkins is a 72 y.o. female who presents for hospital f/u.   Was hospitalized in [facility] about 2 weeks ago for shortness of breath.  Was sent home on prednisone, sample of Advair, and portable oxygen.  Was suppose to be set up with night time O2 through [facility], but they are awaiting paperwork from [facility].  Since coming home from hospital has completed prednisone, feels ok currently.  Not any worse SOB at this time.  Otherwise is compliant with medication.  She has f/u with pulmonology first week of October.  I saw her in May for her routine f/u on diabetes and she feels like diet, activity level is the same, doing fine on medications.  Does need refills though.   The following portions of the patient's history were reviewed and updated as appropriate: allergies, current medications, past family history, past medical history, past social history, past surgical history and problem list.  ROS as in subjective above    Objective:    BP 140/78  Pulse 76  Temp(Src) 97.9 F (36.6 C) (Oral)  Resp 18  Wt 161 lb (73.029 kg)  BMI 28.53 kg/m2  General appearance: alert, no distress, WD/WN Neck: supple, no lymphadenopathy, no thyromegaly, no masses Heart: RRR, normal S1, S2, no murmurs Lungs: decreased breath sounds in general, no wheezes, rhonchi, or rales Abdomen: +bs, soft, obese, central vertical surgical scar, tender right mid to lower abdomen, non distended, no masses, no hepatomegaly, no splenomegaly Pulses: 2+ symmetric, upper and lower extremities, normal cap refill Ext: no edema   Assessment:   Encounter Diagnoses  Name Primary?  . Pulmonary fibrosis Yes  . Hypoxia   . Essential hypertension, benign   . Type II or unspecified type diabetes mellitus without mention of complication, not stated as uncontrolled   . Hyperlipidemia   . Hepatitis C, chronic   . HTN (hypertension)   . Acute bronchitis       Plan:      Pulmonary fibrosis  - reviewed recent hospitalization notes, and her fibrosis has worsened. Had CT there at [facility] .  She is improved  somewhat.  She will f/u with pulmonology in early October.  At this point, she is using Albuterol prn, but not like prior.  We will see if [employer] can speed  up the process for getting tank oxgyen out to the home.  HTN - controlled, c/t same medication DM type II - labs today, she has been controlled Hyperlipidemia - well controlled GERD - controlled on Prilosec Hep C - hx/o interferon therapy. Recent viral load elevated.  Will research therapy options and we can consider referral

## 2013-05-14 ENCOUNTER — Telehealth: Payer: Self-pay | Admitting: Family Medicine

## 2013-05-14 NOTE — Telephone Encounter (Signed)
I called and the patient states that she received her oxygen tank yesterday. CLS

## 2013-05-14 NOTE — Telephone Encounter (Signed)
Message copied by Envy Meno L on Tue May 14, 2013  4:12 PM ------      Message from: Jac Canavan      Created: Mon May 13, 2013  7:47 PM       Call and see if we can help in any way to speed up delivery of her home oxygen.  She was just seen 2 wk in [facility] and was suppose to get QHS tank oxygen.  She has the portable, but needs the tank oxygen for nigh time.            FYI shane - check on Hep C clinic ------

## 2013-05-14 NOTE — Telephone Encounter (Signed)
Called [employer]

## 2013-05-20 ENCOUNTER — Telehealth: Payer: Self-pay | Admitting: Family Medicine

## 2013-05-20 NOTE — Telephone Encounter (Signed)
I fax everything over to the hepatitis C clinic for a referral. CLS Waiting on the appointment. CLS

## 2013-05-20 NOTE — Telephone Encounter (Signed)
Message copied by Janeice Robinson on Mon May 20, 2013 11:15 AM ------      Message from: Jac Canavan      Created: Fri May 17, 2013  5:24 PM       pls refer to hepatitis clinic             shane      ----- Message -----         From: Jac Canavan, PA-C         Sent: 05/13/2013   7:47 PM           To: Jac Canavan, PA-C, Mikki Santee Oumar Marcott            Call and see if we can help in any way to speed up delivery of her home oxygen.  She was just seen 2 wk in [facility] and was suppose to get QHS tank oxygen.  She has the portable, but needs the tank oxygen for nigh time.            FYI shane - check on Hep C clinic       ------

## 2013-05-30 ENCOUNTER — Telehealth: Payer: Self-pay | Admitting: Internal Medicine

## 2013-05-31 NOTE — Telephone Encounter (Signed)
DC summary and CD of CT scan reviewed. Will discuss at next ov.

## 2013-06-03 NOTE — Telephone Encounter (Signed)
Spoke with patient, made her aware of above Appt verified with patient and nothing further needed at this time

## 2013-06-13 ENCOUNTER — Telehealth: Payer: Self-pay | Admitting: Medical

## 2013-06-13 ENCOUNTER — Encounter: Payer: Self-pay | Admitting: Internal Medicine

## 2013-06-13 ENCOUNTER — Ambulatory Visit (INDEPENDENT_AMBULATORY_CARE_PROVIDER_SITE_OTHER): Payer: Medicare Other | Admitting: Internal Medicine

## 2013-06-13 VITALS — BP 126/80 | HR 79 | Ht 63.0 in | Wt 160.0 lb

## 2013-06-13 DIAGNOSIS — J209 Acute bronchitis, unspecified: Secondary | ICD-10-CM

## 2013-06-13 DIAGNOSIS — Z23 Encounter for immunization: Secondary | ICD-10-CM

## 2013-06-13 DIAGNOSIS — J42 Unspecified chronic bronchitis: Secondary | ICD-10-CM

## 2013-06-13 DIAGNOSIS — J8410 Pulmonary fibrosis, unspecified: Secondary | ICD-10-CM

## 2013-06-13 MED ORDER — ALBUTEROL SULFATE HFA 108 (90 BASE) MCG/ACT IN AERS: 2.0000 | INHALATION_SPRAY | Freq: Four times a day (QID) | RESPIRATORY_TRACT | Status: DC | PRN

## 2013-06-13 MED ORDER — BUDESONIDE-FORMOTEROL FUMARATE 160-4.5 MCG/ACT IN AERO: INHALATION_SPRAY | RESPIRATORY_TRACT | Status: DC

## 2013-06-13 MED ORDER — BENZONATATE 200 MG PO CAPS: 200.0000 mg | ORAL_CAPSULE | Freq: Three times a day (TID) | ORAL | Status: DC | PRN

## 2013-06-13 NOTE — Progress Notes (Signed)
10/04/12- 33 yoF former smoker referred by [name], PA. She describes shortness of breath x 6 months with no prior respiratory complaint. Pain left posterior lateral ribs for about the same interval, related to deep breath and twisting. Chronic cough with scant thick white sputum. Occasional night sweats without weight change or adenopathy. No blood or fever and nothing purulent. Has been anemic. History of hepatitis C. She has Ventolin and Symbicort used unevenly. She is unclear that they help. PFT 08/22/12- severe restriction, mild obstruction with resp to BD, DLCo severely reduced. FEV1 0.91/48%, FVC 1.12/ 41%, FEV1/FVC 0.81, FEF 25-75% 1.01/ 46%, TLC 46%, DLCO 28% CXR 11/17/11 IMPRESSION:  1. No acute cardia no pulmonary abnormalities.  2. Chronic interstitial changes, similar to previous exam.  Original Report Authenticated By: Angelita Ingles, M.D. MR chest 09/24/10 IMPRESSION:  1. 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 Although possibly from severe degenerative  arthropathy, the edema in the joint and adjacent osseous structures  raises the possibility of septic joint and osteomyelitis. No extra-  articular fluid collection is identified to provide greater  positive predictive value for septic joint. Correlate with  chronicity in onset of symptoms as well as other signs of infection  in determining the need for treatment or attempt at arthrocentesis.  2. Suspected atypical infectious bronchiolitis in the right lung  apex.  Provider: Weyman Rodney CT chest 07/20/10 IMPRESSION: Multifocal areas of peribronchial opacities involving  the upper lobes along with diffuse bronchial wall thickening is  most compatible with bronchopneumonia.  Provider: Elizabeth Sauer  11/06/12- 71 yoF former smoker referred by [name], PA. FOLLOWS FOR: has not had 6MW test done;  Increased SOB-getting worse. Admits she has been struggling along with shortness of breath with exertion probably several months. An intermittent, somewhat pleuritic pain, left lateral chest is not new but is increased when she coughs. Notices frequent coughing spells with thick white sputum. Denies fever, blood, adenopathy, swelling, anginal pain or palpitation. PFT: 08/22/2012 severe restriction of total lung capacity, mild obstructive airways disease in small airways with response to bronchodilator, diffusion severely reduced. FVC 1.12/41%, FEV1 0.91/48%, FEV1/FVC 0.81, FEF 25-75% 1.01/46%. TLC 46%, DLCO 28%. CT chest 10/17/12 IMPRESSION:  1. Chronic interstitial changes of scarring, reticulation, and  cylindrical type bronchiectasis noted. Although there is no zonal  predominance or evidence of frank honeycombing these findings are  suspicious for early usual interstitial pneumonitis.  Original Report Authenticated By: Kerby Moors, M.D. L Rib Xray 09/03/12- NEG ANA neg, Sed rate 15  12/13/12-71 yoF former smoker followed for Interstitial lung disease/ fibrosis/ bronchiectasis.   PCP [name], PA. FOLLOWS FOR: review ONO and 6MW test results with patient.  Pt states breathing is better since last OV. Pt would like to have Rx for Tussionex. Still coughing. Denies seasonal allergy. Sputum cultures from 11/06/2012 all negative so far with normal flora. Overnight oximetry 11/07/2012- adequate, not qualify for O2 during sleep. 6MWT- 12/13/12- 94%, 79%, 96%, 447 m. Desaturated during exertion. Good recovery.  06/13/13- 81 yoF former smoker followed for Interstitial lung disease/ fibrosis/bronchiectasis, complicated by hepatitis C.   PCP [name], PA FOLLOWS FOR: chest tightness, increased wheezing; needs surgery clearance for cataract surgery(anticipated date is 06-25-13) Gradually more dyspnea with exertion stops her every couple of rooms. Still on home oxygen prescribed from [facility]  clinic for sleep and exertion when she was there this summer. They diagnosed pulmonary fibrosis with no  biopsy. Cough productive thick white sputum, no fever, blood or chest pain. Tussionex not much help for her cough. We reviewed images on disc from [facility] clinic  ROS-see HPI Constitutional:   No-   weight loss, night sweats, fevers, chills, fatigue, lassitude. HEENT:   No-  headaches, difficulty swallowing, tooth/dental problems, sore throat,       No-  sneezing, itching, ear ache, nasal congestion, post nasal drip,  CV:  No-   chest pain, orthopnea, PND, swelling in lower extremities, anasarca, dizziness, palpitations Resp: + shortness of breath with exertion or at rest.              + productive cough,  + non-productive cough,  No- coughing up of blood.              No-   change in color of mucus.  No- wheezing.   Skin: No-   rash or lesions. GI:  No-   heartburn, indigestion,  abdominal pain, nausea, vomiting, GU:  MS:  +joint pain or swelling.  .  + back pain. Neuro-     nothing unusual Psych:  No- change in mood or affect. No depression or anxiety.  No memory loss.  OBJ- Physical Exam General- Alert, Oriented, Affect-appropriate, Distress- none acute. Overweight. Skin- rash-none, lesions- none, excoriation- none Lymphadenopathy- none Head- atraumatic            Eyes- Gross vision intact, PERRLA, conjunctivae and secretions clear            Ears- Hearing, canals-normal            Nose- Clear, no-Septal dev, mucus, polyps, erosion, perforation             Throat- Mallampati II , mucosa red , drainage- none, tonsils- atrophic Neck- flexible , trachea midline, no stridor , thyroid nl, carotid no bruit Chest - symmetrical excursion , unlabored           Heart/CV- RRR , no murmur , no gallop  , no rub, nl s1 s2                           - JVD+1 cm , edema- none, stasis changes- none, varices- none           Lung- +shallow, wheeze+, cough+dry , dullness-none, rub- none. No  crackles           Chest wall-  Abd-  Br/ Gen/ Rectal- Not done, not indicated Extrem- cyanosis- none, clubbing, none, atrophy- none, strength- nl Neuro- grossly intact to observation

## 2013-06-13 NOTE — Telephone Encounter (Signed)
Pt called requesting samples of Ventolin and Symbicort.  Please call her if she is able to pick them up.

## 2013-06-13 NOTE — Telephone Encounter (Signed)
Called pt to let her know sample is ready for her to pick up

## 2013-06-13 NOTE — Patient Instructions (Addendum)
Ok to continue oxygen/ [employer] 2 L for exertion and sleep   Ok to go forward with cataract surgery. Suggest you wear oxygen at 2 L/min for this surgery.   Pace yourself and stop to rest when you need to.  Scripts for ventolin rescue inhaler and for Symbicort 160 maintenance inhaler  Hi strength flu vax

## 2013-06-17 ENCOUNTER — Ambulatory Visit: Payer: Medicare Other | Attending: Orthopaedic Surgery

## 2013-06-17 DIAGNOSIS — M2550 Pain in unspecified joint: Secondary | ICD-10-CM | POA: Insufficient documentation

## 2013-06-17 DIAGNOSIS — M2560 Stiffness of unspecified joint, not elsewhere classified: Secondary | ICD-10-CM | POA: Insufficient documentation

## 2013-06-17 DIAGNOSIS — R262 Difficulty in walking, not elsewhere classified: Secondary | ICD-10-CM | POA: Insufficient documentation

## 2013-06-17 DIAGNOSIS — M6281 Muscle weakness (generalized): Secondary | ICD-10-CM | POA: Insufficient documentation

## 2013-06-17 DIAGNOSIS — IMO0001 Reserved for inherently not codable concepts without codable children: Secondary | ICD-10-CM | POA: Insufficient documentation

## 2013-06-18 ENCOUNTER — Telehealth: Payer: Self-pay | Admitting: Family Medicine

## 2013-06-18 NOTE — Telephone Encounter (Signed)
I referred Margaret Adkins over to the Hep C clinic per your request. The Hep C clinic called as well as the patient to let us know she could not make her appointment right now. She will be out of town for the next 3 months in [place]. CLS

## 2013-06-20 ENCOUNTER — Ambulatory Visit: Payer: Medicare Other

## 2013-06-24 ENCOUNTER — Ambulatory Visit: Payer: Medicare Other

## 2013-06-29 DIAGNOSIS — J42 Unspecified chronic bronchitis: Secondary | ICD-10-CM | POA: Insufficient documentation

## 2013-06-29 NOTE — Assessment & Plan Note (Signed)
His home oxygen for sleep and exertion prescribed from [facility] clinic

## 2013-06-29 NOTE — Assessment & Plan Note (Signed)
Plan-albuterol rescue inhaler, Symbicort, continue oxygen, high-dose flu vaccine. Okay to proceed with cataract surgery

## 2013-07-01 ENCOUNTER — Ambulatory Visit: Payer: Medicare Other

## 2013-07-04 ENCOUNTER — Other Ambulatory Visit: Payer: Self-pay | Admitting: *Deleted

## 2013-07-04 ENCOUNTER — Ambulatory Visit: Payer: Medicare Other

## 2013-07-04 ENCOUNTER — Telehealth: Payer: Self-pay | Admitting: Internal Medicine

## 2013-07-04 DIAGNOSIS — I10 Essential (primary) hypertension: Secondary | ICD-10-CM

## 2013-07-04 MED ORDER — OLMESARTAN MEDOXOMIL-HCTZ 40-25 MG PO TABS: 1.0000 | ORAL_TABLET | Freq: Every day | ORAL | Status: DC

## 2013-07-04 NOTE — Telephone Encounter (Signed)
Done

## 2013-07-04 NOTE — Telephone Encounter (Signed)
done

## 2013-07-04 NOTE — Telephone Encounter (Signed)
Pt needs a refill on benicar to [facility] [place]

## 2013-09-13 ENCOUNTER — Ambulatory Visit: Payer: Medicare Other | Admitting: Medical

## 2013-11-06 ENCOUNTER — Other Ambulatory Visit (INDEPENDENT_AMBULATORY_CARE_PROVIDER_SITE_OTHER): Payer: Medicare Other

## 2013-11-06 ENCOUNTER — Ambulatory Visit (INDEPENDENT_AMBULATORY_CARE_PROVIDER_SITE_OTHER)
Admission: RE | Admit: 2013-11-06 | Discharge: 2013-11-06 | Disposition: A | Discharge status: Home / Self Care | Payer: Medicare Other | Source: Ambulatory Visit | Attending: Internal Medicine | Admitting: Internal Medicine

## 2013-11-06 ENCOUNTER — Ambulatory Visit (INDEPENDENT_AMBULATORY_CARE_PROVIDER_SITE_OTHER): Payer: Medicare Other | Admitting: Internal Medicine

## 2013-11-06 ENCOUNTER — Encounter: Payer: Self-pay | Admitting: Internal Medicine

## 2013-11-06 VITALS — BP 120/72 | HR 81 | Ht 63.0 in | Wt 154.6 lb

## 2013-11-06 DIAGNOSIS — R0989 Other specified symptoms and signs involving the circulatory and respiratory systems: Secondary | ICD-10-CM

## 2013-11-06 DIAGNOSIS — J8410 Pulmonary fibrosis, unspecified: Secondary | ICD-10-CM

## 2013-11-06 DIAGNOSIS — R0609 Other forms of dyspnea: Secondary | ICD-10-CM

## 2013-11-06 DIAGNOSIS — R0600 Dyspnea, unspecified: Secondary | ICD-10-CM

## 2013-11-06 DIAGNOSIS — R071 Chest pain on breathing: Secondary | ICD-10-CM

## 2013-11-06 DIAGNOSIS — R0789 Other chest pain: Secondary | ICD-10-CM

## 2013-11-06 LAB — CBC WITH DIFFERENTIAL/PLATELET
BASOS ABS: 0 10*3/uL (ref 0.0–0.1)
Basophils Relative: 0.5 % (ref 0.0–3.0)
EOS ABS: 0.3 10*3/uL (ref 0.0–0.7)
Eosinophils Relative: 4.1 % (ref 0.0–5.0)
HCT: 38.8 % (ref 36.0–46.0)
Hemoglobin: 12.4 g/dL (ref 12.0–15.0)
LYMPHS PCT: 32.7 % (ref 12.0–46.0)
Lymphs Abs: 2.4 10*3/uL (ref 0.7–4.0)
MCHC: 31.9 g/dL (ref 30.0–36.0)
MCV: 90.7 fl (ref 78.0–100.0)
Monocytes Absolute: 0.5 10*3/uL (ref 0.1–1.0)
Monocytes Relative: 6.5 % (ref 3.0–12.0)
NEUTROS ABS: 4.2 10*3/uL (ref 1.4–7.7)
Neutrophils Relative %: 56.2 % (ref 43.0–77.0)
Platelets: 226 10*3/uL (ref 150.0–400.0)
RBC: 4.28 Mil/uL (ref 3.87–5.11)
RDW: 13.8 % (ref 11.5–14.6)
WBC: 7.4 10*3/uL (ref 4.5–10.5)

## 2013-11-06 LAB — ANGIOTENSIN CONVERTING ENZYME: ANGIOTENSIN-CONVERTING ENZYME: 54 U/L — AB (ref 8–52)

## 2013-11-06 MED ORDER — TRAMADOL HCL 50 MG PO TABS: ORAL_TABLET | ORAL | Status: DC

## 2013-11-06 NOTE — Progress Notes (Signed)
10/04/12- 27 yoF former smoker referred by [name], PA. She describes shortness of breath x 6 months with no prior respiratory complaint. Pain left posterior lateral ribs for about the same interval, related to deep breath and twisting. Chronic cough with scant thick white sputum. Occasional night sweats without weight change or adenopathy. No blood or fever and nothing purulent. Has been anemic. History of hepatitis C. She has Ventolin and Symbicort used unevenly. She is unclear that they help. PFT 08/22/12- severe restriction, mild obstruction with resp to BD, DLCo severely reduced. FEV1 0.91/48%, FVC 1.12/ 41%, FEV1/FVC 0.81, FEF 25-75% 1.01/ 46%, TLC 46%, DLCO 28% CXR 11/17/11 IMPRESSION:  1. No acute cardia no pulmonary abnormalities.  2. Chronic interstitial changes, similar to previous exam.  Original Report Authenticated By: Angelita Ingles, M.D. MR chest 09/24/10 IMPRESSION:  1. 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 Although possibly from severe degenerative  arthropathy, the edema in the joint and adjacent osseous structures  raises the possibility of septic joint and osteomyelitis. No extra-  articular fluid collection is identified to provide greater  positive predictive value for septic joint. Correlate with  chronicity in onset of symptoms as well as other signs of infection  in determining the need for treatment or attempt at arthrocentesis.  2. Suspected atypical infectious bronchiolitis in the right lung  apex.  Provider: Weyman Rodney CT chest 07/20/10 IMPRESSION: Multifocal areas of peribronchial opacities involving  the upper lobes along with diffuse bronchial wall thickening is  most compatible with bronchopneumonia.  Provider: Elizabeth Sauer  11/06/12- 71 yoF former smoker referred by [name], PA. FOLLOWS FOR: has not had 6MW test done;  Increased SOB-getting worse. Admits she has been struggling along with shortness of breath with exertion probably several months. An intermittent, somewhat pleuritic pain, left lateral chest is not new but is increased when she coughs. Notices frequent coughing spells with thick white sputum. Denies fever, blood, adenopathy, swelling, anginal pain or palpitation. PFT: 08/22/2012 severe restriction of total lung capacity, mild obstructive airways disease in small airways with response to bronchodilator, diffusion severely reduced. FVC 1.12/41%, FEV1 0.91/48%, FEV1/FVC 0.81, FEF 25-75% 1.01/46%. TLC 46%, DLCO 28%. CT chest 10/17/12 IMPRESSION:  1. Chronic interstitial changes of scarring, reticulation, and  cylindrical type bronchiectasis noted. Although there is no zonal  predominance or evidence of frank honeycombing these findings are  suspicious for early usual interstitial pneumonitis.  Original Report Authenticated By: Kerby Moors, M.D. L Rib Xray 09/03/12- NEG ANA neg, Sed rate 15  12/13/12-71 yoF former smoker followed for Interstitial lung disease/ fibrosis/ bronchiectasis.   PCP [name], PA. FOLLOWS FOR: review ONO and 6MW test results with patient.  Pt states breathing is better since last OV. Pt would like to have Rx for Tussionex. Still coughing. Denies seasonal allergy. Sputum cultures from 11/06/2012 all negative so far with normal flora. Overnight oximetry 11/07/2012- adequate, not qualify for O2 during sleep. 6MWT- 12/13/12- 94%, 79%, 96%, 447 m. Desaturated during exertion. Good recovery.  06/13/13- 57 yoF former smoker followed for Interstitial lung disease/ fibrosis/bronchiectasis, complicated by hepatitis C.   PCP [name], PA FOLLOWS FOR: chest tightness, increased wheezing; needs surgery clearance for cataract surgery(anticipated date is 06-25-13) Gradually more dyspnea with exertion stops her every couple of rooms. Still on home oxygen prescribed from [facility]  clinic for sleep and exertion when she was there this summer. They diagnosed pulmonary fibrosis with no  biopsy. Cough productive thick white sputum, no fever, blood or chest pain. Tussionex not much help for her cough. We reviewed images on disc from [facility]  11/06/13- 72 yoF former smoker followed for Interstitial lung disease/ fibrosis/bronchiectasis, complicated by hepatitis C.   PCP [name], PA FOLLOWS FOR: continues to have increased SOB even with O2 on.  CT reported in dc summary from [facility] 04/21/13 had referred to progression of pulmonary fibrosis, unspecified, negative for PE.  ANA and Sed rate Neg. WNL 09/2012. Has been w family in [place] through winter and Rx'd there for pneumonia outpt. Continues O2 2L/ [employer].  C/O persistent pain L post lat ribs x 3-4 months, worse w deep breath, never gone. Rx'd Aleve. No fever. Cough prod thick white. No palpitation, leg pain, ankle edema, nodes or masses.    ROS-see HPI Constitutional:   No-   weight loss, night sweats, fevers, chills, fatigue, lassitude. HEENT:   No-  headaches, difficulty swallowing, tooth/dental problems, sore throat,       No-  sneezing, itching, ear ache, nasal congestion, post nasal drip,  CV:  No-   chest pain, orthopnea, PND, swelling in lower extremities, anasarca, dizziness, palpitations Resp: + shortness of breath with exertion or at rest.              + productive cough,  + non-productive cough,  No- coughing up of blood.              No-   change in color of mucus.  No- wheezing.   Skin: No-   rash or lesions. GI:  No-   heartburn, indigestion,  abdominal pain, nausea, vomiting, GU:  MS:  +joint pain or swelling.  .  +   Leftback pain. Neuro-     nothing unusual Psych:  No- change in mood or affect. No depression or anxiety.  No memory loss.  OBJ- Physical Exam General- Alert, Oriented, Affect-appropriate, Distress- none acute. Overweight. O2 2L Skin- rash-none, lesions- none,  excoriation- none Lymphadenopathy- none Head- atraumatic            Eyes- Gross vision intact, PERRLA, conjunctivae and secretions clear            Ears- Hearing, canals-normal            Nose- Clear, no-Septal dev, mucus, polyps, erosion, perforation             Throat- Mallampati II , mucosa red , drainage- none, tonsils- atrophic Neck- flexible , trachea midline, no stridor , thyroid nl, carotid no bruit Chest - symmetrical excursion , unlabored           Heart/CV- RRR , no murmur , no gallop  , no rub, nl s1 s2                           - JVD+1 cm , edema- none, stasis changes- none, varices- none           Lung- +shallow, wheeze- none, cough+dry , dullness-none, rub- none. No crackles           Chest wall- no mass or crepitus Abd-  Br/ Gen/ Rectal- Not done, not indicated Extrem- cyanosis- none, clubbing, none, atrophy- none, strength- nl Neuro- grossly intact to observation

## 2013-11-06 NOTE — Assessment & Plan Note (Addendum)
Plan- update CXR, tramadol for pain, D-dimer to exclude PE, although that should not hurt for this long.

## 2013-11-06 NOTE — Assessment & Plan Note (Signed)
This is probably UIP. Will update CT here for our longitudinal comparison. No bx but she may meet xray standards to qualify for perfenadone. Plan- ACE level, Ct chest, ok O2 up to 3 L. Consider home Health care assessment.

## 2013-11-06 NOTE — Patient Instructions (Addendum)
Increase O2 to 3L/ min  Order- CXR with left rib details- dx pulmonary fibrosis, left chest pain              Lab- CBC w diff, D dimer, ACE level      Dyspnea, pulmonary fibrosis  Script for Tramadol   For pain      1-2 every 6 hours if needed

## 2013-11-07 LAB — D-DIMER, QUANTITATIVE (NOT AT ARMC): D DIMER QUANT: 0.35 ug{FEU}/mL (ref 0.00–0.48)

## 2013-11-11 ENCOUNTER — Other Ambulatory Visit: Payer: Self-pay | Admitting: Internal Medicine

## 2013-11-11 DIAGNOSIS — J8410 Pulmonary fibrosis, unspecified: Secondary | ICD-10-CM

## 2013-11-19 ENCOUNTER — Ambulatory Visit (INDEPENDENT_AMBULATORY_CARE_PROVIDER_SITE_OTHER)
Admission: RE | Admit: 2013-11-19 | Discharge: 2013-11-19 | Disposition: A | Discharge status: Home / Self Care | Payer: Medicare Other | Source: Ambulatory Visit | Attending: Internal Medicine | Admitting: Internal Medicine

## 2013-11-19 DIAGNOSIS — J8410 Pulmonary fibrosis, unspecified: Secondary | ICD-10-CM

## 2013-11-27 ENCOUNTER — Emergency Department (HOSPITAL_COMMUNITY): Payer: Medicare Other

## 2013-11-27 ENCOUNTER — Ambulatory Visit (INDEPENDENT_AMBULATORY_CARE_PROVIDER_SITE_OTHER): Payer: Medicare Other | Admitting: Medical

## 2013-11-27 ENCOUNTER — Inpatient Hospital Stay (HOSPITAL_COMMUNITY)
Admission: EM | Admit: 2013-11-27 | Discharge: 2013-12-03 | DRG: 196 | Disposition: A | Discharge status: Home / Self Care | Payer: Medicare Other | Attending: Internal Medicine | Admitting: Internal Medicine

## 2013-11-27 ENCOUNTER — Encounter (HOSPITAL_COMMUNITY): Payer: Self-pay | Admitting: General Practice

## 2013-11-27 ENCOUNTER — Encounter: Payer: Self-pay | Admitting: Medical

## 2013-11-27 VITALS — BP 152/80 | HR 82 | Temp 97.3°F | Resp 40 | Wt 150.0 lb

## 2013-11-27 DIAGNOSIS — R0989 Other specified symptoms and signs involving the circulatory and respiratory systems: Secondary | ICD-10-CM

## 2013-11-27 DIAGNOSIS — I10 Essential (primary) hypertension: Secondary | ICD-10-CM

## 2013-11-27 DIAGNOSIS — R071 Chest pain on breathing: Secondary | ICD-10-CM

## 2013-11-27 DIAGNOSIS — E785 Hyperlipidemia, unspecified: Secondary | ICD-10-CM | POA: Diagnosis present

## 2013-11-27 DIAGNOSIS — J029 Acute pharyngitis, unspecified: Secondary | ICD-10-CM

## 2013-11-27 DIAGNOSIS — J42 Unspecified chronic bronchitis: Secondary | ICD-10-CM

## 2013-11-27 DIAGNOSIS — J8410 Pulmonary fibrosis, unspecified: Secondary | ICD-10-CM

## 2013-11-27 DIAGNOSIS — J9620 Acute and chronic respiratory failure, unspecified whether with hypoxia or hypercapnia: Secondary | ICD-10-CM

## 2013-11-27 DIAGNOSIS — K219 Gastro-esophageal reflux disease without esophagitis: Secondary | ICD-10-CM

## 2013-11-27 DIAGNOSIS — M810 Age-related osteoporosis without current pathological fracture: Secondary | ICD-10-CM | POA: Diagnosis present

## 2013-11-27 DIAGNOSIS — J84112 Idiopathic pulmonary fibrosis: Principal | ICD-10-CM

## 2013-11-27 DIAGNOSIS — E119 Type 2 diabetes mellitus without complications: Secondary | ICD-10-CM

## 2013-11-27 DIAGNOSIS — Z7982 Long term (current) use of aspirin: Secondary | ICD-10-CM

## 2013-11-27 DIAGNOSIS — R0682 Tachypnea, not elsewhere classified: Secondary | ICD-10-CM

## 2013-11-27 DIAGNOSIS — R0902 Hypoxemia: Secondary | ICD-10-CM

## 2013-11-27 DIAGNOSIS — Z9981 Dependence on supplemental oxygen: Secondary | ICD-10-CM

## 2013-11-27 DIAGNOSIS — R0602 Shortness of breath: Secondary | ICD-10-CM

## 2013-11-27 DIAGNOSIS — Z66 Do not resuscitate: Secondary | ICD-10-CM | POA: Diagnosis present

## 2013-11-27 DIAGNOSIS — B1920 Unspecified viral hepatitis C without hepatic coma: Secondary | ICD-10-CM | POA: Diagnosis present

## 2013-11-27 DIAGNOSIS — Z79899 Other long term (current) drug therapy: Secondary | ICD-10-CM

## 2013-11-27 DIAGNOSIS — E1165 Type 2 diabetes mellitus with hyperglycemia: Secondary | ICD-10-CM

## 2013-11-27 DIAGNOSIS — E7800 Pure hypercholesterolemia, unspecified: Secondary | ICD-10-CM

## 2013-11-27 DIAGNOSIS — J479 Bronchiectasis, uncomplicated: Secondary | ICD-10-CM | POA: Diagnosis present

## 2013-11-27 DIAGNOSIS — R0600 Dyspnea, unspecified: Secondary | ICD-10-CM

## 2013-11-27 DIAGNOSIS — IMO0001 Reserved for inherently not codable concepts without codable children: Secondary | ICD-10-CM | POA: Diagnosis present

## 2013-11-27 DIAGNOSIS — R0609 Other forms of dyspnea: Secondary | ICD-10-CM

## 2013-11-27 DIAGNOSIS — R0789 Other chest pain: Secondary | ICD-10-CM

## 2013-11-27 DIAGNOSIS — Z87891 Personal history of nicotine dependence: Secondary | ICD-10-CM

## 2013-11-27 LAB — CBC WITH DIFFERENTIAL/PLATELET
Basophils Absolute: 0 10*3/uL (ref 0.0–0.1)
Basophils Relative: 0 % (ref 0–1)
EOS PCT: 1 % (ref 0–5)
Eosinophils Absolute: 0.1 10*3/uL (ref 0.0–0.7)
HEMATOCRIT: 40.7 % (ref 36.0–46.0)
Hemoglobin: 13.3 g/dL (ref 12.0–15.0)
LYMPHS ABS: 2.5 10*3/uL (ref 0.7–4.0)
LYMPHS PCT: 32 % (ref 12–46)
MCH: 29.4 pg (ref 26.0–34.0)
MCHC: 32.7 g/dL (ref 30.0–36.0)
MCV: 90 fL (ref 78.0–100.0)
MONO ABS: 0.4 10*3/uL (ref 0.1–1.0)
Monocytes Relative: 5 % (ref 3–12)
Neutro Abs: 4.8 10*3/uL (ref 1.7–7.7)
Neutrophils Relative %: 61 % (ref 43–77)
Platelets: 185 10*3/uL (ref 150–400)
RBC: 4.52 MIL/uL (ref 3.87–5.11)
RDW: 13.5 % (ref 11.5–15.5)
WBC: 7.9 10*3/uL (ref 4.0–10.5)

## 2013-11-27 LAB — COMPREHENSIVE METABOLIC PANEL
ALT: 23 U/L (ref 0–35)
AST: 33 U/L (ref 0–37)
Albumin: 4.3 g/dL (ref 3.5–5.2)
Alkaline Phosphatase: 72 U/L (ref 39–117)
BILIRUBIN TOTAL: 0.5 mg/dL (ref 0.3–1.2)
BUN: 13 mg/dL (ref 6–23)
CO2: 24 mEq/L (ref 19–32)
CREATININE: 0.83 mg/dL (ref 0.50–1.10)
Calcium: 10.1 mg/dL (ref 8.4–10.5)
Chloride: 95 mEq/L — ABNORMAL LOW (ref 96–112)
GFR calc Af Amer: 80 mL/min — ABNORMAL LOW (ref >90)
GFR calc non Af Amer: 69 mL/min — ABNORMAL LOW (ref >90)
Glucose, Bld: 87 mg/dL (ref 70–99)
Potassium: 4.3 mEq/L (ref 3.7–5.3)
Sodium: 141 mEq/L (ref 137–147)
Total Protein: 8.6 g/dL — ABNORMAL HIGH (ref 6.0–8.3)

## 2013-11-27 LAB — I-STAT ARTERIAL BLOOD GAS, ED
Acid-Base Excess: 4 mmol/L — ABNORMAL HIGH (ref 0.0–2.0)
Acid-Base Excess: 4 mmol/L — ABNORMAL HIGH (ref 0.0–2.0)
BICARBONATE: 29.5 meq/L — AB (ref 20.0–24.0)
BICARBONATE: 29.7 meq/L — AB (ref 20.0–24.0)
O2 Saturation: 100 %
O2 Saturation: 53 %
PCO2 ART: 45.7 mmHg — AB (ref 35.0–45.0)
PH ART: 7.42 (ref 7.350–7.450)
PO2 ART: 27 mmHg — AB (ref 80.0–100.0)
Patient temperature: 97.9
Patient temperature: 97.9
TCO2: 31 mmol/L (ref 0–100)
TCO2: 31 mmol/L (ref 0–100)
pCO2 arterial: 46.7 mmHg — ABNORMAL HIGH (ref 35.0–45.0)
pH, Arterial: 7.407 (ref 7.350–7.450)
pO2, Arterial: 275 mmHg — ABNORMAL HIGH (ref 80.0–100.0)

## 2013-11-27 LAB — CBC
HEMATOCRIT: 36 % (ref 36.0–46.0)
Hemoglobin: 11.5 g/dL — ABNORMAL LOW (ref 12.0–15.0)
MCH: 29 pg (ref 26.0–34.0)
MCHC: 31.9 g/dL (ref 30.0–36.0)
MCV: 90.9 fL (ref 78.0–100.0)
Platelets: 179 10*3/uL (ref 150–400)
RBC: 3.96 MIL/uL (ref 3.87–5.11)
RDW: 13.5 % (ref 11.5–15.5)
WBC: 6.3 10*3/uL (ref 4.0–10.5)

## 2013-11-27 LAB — LIPASE, BLOOD: Lipase: 57 U/L (ref 11–59)

## 2013-11-27 LAB — CREATININE, SERUM
Creatinine, Ser: 0.83 mg/dL (ref 0.50–1.10)
GFR calc non Af Amer: 69 mL/min — ABNORMAL LOW (ref >90)
GFR, EST AFRICAN AMERICAN: 80 mL/min — AB (ref >90)

## 2013-11-27 LAB — LIPID PANEL
Cholesterol: 134 mg/dL (ref 0–200)
HDL: 73 mg/dL (ref >39)
LDL CALC: 53 mg/dL (ref 0–99)
Total CHOL/HDL Ratio: 1.8 RATIO
Triglycerides: 40 mg/dL (ref <150)
VLDL: 8 mg/dL (ref 0–40)

## 2013-11-27 LAB — I-STAT CG4 LACTIC ACID, ED: Lactic Acid, Venous: 0.9 mmol/L (ref 0.5–2.2)

## 2013-11-27 LAB — PRO B NATRIURETIC PEPTIDE
Pro B Natriuretic peptide (BNP): 287.7 pg/mL — ABNORMAL HIGH (ref 0–125)
Pro B Natriuretic peptide (BNP): 226.3 pg/mL — ABNORMAL HIGH (ref 0–125)

## 2013-11-27 LAB — TSH: TSH: 0.81 u[IU]/mL (ref 0.350–4.500)

## 2013-11-27 LAB — TROPONIN I: Troponin I: <0.3 ng/mL (ref <0.30)

## 2013-11-27 LAB — GLUCOSE, CAPILLARY: Glucose-Capillary: 138 mg/dL — ABNORMAL HIGH (ref 70–99)

## 2013-11-27 MED ORDER — HYDROCHLOROTHIAZIDE 25 MG PO TABS
25.0000 mg | ORAL_TABLET | Freq: Every day | ORAL | Status: DC
  Administered 2013-11-27 – 2013-11-29 (×3): 25 mg via ORAL
  Filled 2013-11-27 (×4): qty 1

## 2013-11-27 MED ORDER — SODIUM CHLORIDE 0.9 % IV SOLN
INTRAVENOUS | Status: DC
  Administered 2013-11-27: 18:00:00 via INTRAVENOUS

## 2013-11-27 MED ORDER — OLMESARTAN MEDOXOMIL-HCTZ 40-25 MG PO TABS: 1.0000 | ORAL_TABLET | Freq: Every day | ORAL | Status: DC

## 2013-11-27 MED ORDER — BUDESONIDE-FORMOTEROL FUMARATE 160-4.5 MCG/ACT IN AERO
2.0000 | INHALATION_SPRAY | Freq: Two times a day (BID) | RESPIRATORY_TRACT | Status: DC
  Administered 2013-11-28 – 2013-12-03 (×11): 2 via RESPIRATORY_TRACT
  Filled 2013-11-27: qty 6

## 2013-11-27 MED ORDER — ONDANSETRON HCL 4 MG/2ML IJ SOLN
4.0000 mg | Freq: Four times a day (QID) | INTRAMUSCULAR | Status: DC | PRN
  Administered 2013-11-28: 4 mg via INTRAVENOUS
  Filled 2013-11-27: qty 2

## 2013-11-27 MED ORDER — ACETAMINOPHEN 325 MG PO TABS
650.0000 mg | ORAL_TABLET | Freq: Four times a day (QID) | ORAL | Status: DC | PRN
  Administered 2013-11-29 – 2013-12-01 (×3): 650 mg via ORAL
  Filled 2013-11-27 (×3): qty 2

## 2013-11-27 MED ORDER — ENOXAPARIN SODIUM 40 MG/0.4ML ~~LOC~~ SOLN
40.0000 mg | SUBCUTANEOUS | Status: DC
Start: 2013-11-27 — End: 2013-12-03
  Administered 2013-11-27 – 2013-12-02 (×6): 40 mg via SUBCUTANEOUS
  Filled 2013-11-27 (×7): qty 0.4

## 2013-11-27 MED ORDER — METHYLPREDNISOLONE SODIUM SUCC 40 MG IJ SOLR
40.0000 mg | INTRAMUSCULAR | Status: DC
  Administered 2013-11-27 – 2013-11-30 (×4): 40 mg via INTRAVENOUS
  Filled 2013-11-27 (×4): qty 1

## 2013-11-27 MED ORDER — NITROGLYCERIN 0.4 MG SL SUBL: 0.4000 mg | SUBLINGUAL_TABLET | SUBLINGUAL | Status: DC | PRN

## 2013-11-27 MED ORDER — SIMVASTATIN 20 MG PO TABS
20.0000 mg | ORAL_TABLET | Freq: Every day | ORAL | Status: DC
  Administered 2013-11-28 – 2013-12-02 (×5): 20 mg via ORAL
  Filled 2013-11-27 (×6): qty 1

## 2013-11-27 MED ORDER — ASPIRIN EC 81 MG PO TBEC
81.0000 mg | DELAYED_RELEASE_TABLET | Freq: Every day | ORAL | Status: DC
  Administered 2013-11-27 – 2013-12-03 (×7): 81 mg via ORAL
  Filled 2013-11-27 (×7): qty 1

## 2013-11-27 MED ORDER — DIPHENHYDRAMINE HCL 25 MG PO CAPS
25.0000 mg | ORAL_CAPSULE | Freq: Four times a day (QID) | ORAL | Status: DC | PRN
Start: 2013-11-27 — End: 2013-12-03

## 2013-11-27 MED ORDER — SODIUM CHLORIDE 0.9 % IV SOLN
INTRAVENOUS | Status: DC
  Administered 2013-11-27 – 2013-11-29 (×4): via INTRAVENOUS
  Administered 2013-11-29: 100 mL/h via INTRAVENOUS

## 2013-11-27 MED ORDER — OMEGA-3 FATTY ACIDS 1000 MG PO CAPS
2.0000 g | ORAL_CAPSULE | Freq: Every day | ORAL | Status: DC
  Administered 2013-11-28 – 2013-12-03 (×5): 2 g via ORAL
  Filled 2013-11-27 (×7): qty 2

## 2013-11-27 MED ORDER — IPRATROPIUM-ALBUTEROL 0.5-2.5 (3) MG/3ML IN SOLN
3.0000 mL | Freq: Once | RESPIRATORY_TRACT | Status: AC
  Administered 2013-11-27: 3 mL via RESPIRATORY_TRACT

## 2013-11-27 MED ORDER — ACETAMINOPHEN 650 MG RE SUPP: 650.0000 mg | Freq: Four times a day (QID) | RECTAL | Status: DC | PRN

## 2013-11-27 MED ORDER — TRAMADOL HCL 50 MG PO TABS
50.0000 mg | ORAL_TABLET | Freq: Four times a day (QID) | ORAL | Status: DC | PRN
  Administered 2013-11-27: 50 mg via ORAL
  Filled 2013-11-27: qty 1

## 2013-11-27 MED ORDER — LEVALBUTEROL HCL 0.63 MG/3ML IN NEBU
0.6300 mg | INHALATION_SOLUTION | Freq: Four times a day (QID) | RESPIRATORY_TRACT | Status: DC
  Administered 2013-11-28 (×2): 0.63 mg via RESPIRATORY_TRACT
  Filled 2013-11-27 (×9): qty 3

## 2013-11-27 MED ORDER — IRBESARTAN 300 MG PO TABS
300.0000 mg | ORAL_TABLET | Freq: Every day | ORAL | Status: DC
  Administered 2013-11-27 – 2013-11-29 (×3): 300 mg via ORAL
  Filled 2013-11-27 (×4): qty 1

## 2013-11-27 MED ORDER — TRAVOPROST 0.004 % OP SOLN
1.0000 [drp] | Freq: Every day | OPHTHALMIC | Status: DC
  Administered 2013-11-27 – 2013-12-02 (×6): 1 [drp] via OPHTHALMIC
  Filled 2013-11-27: qty 2.5

## 2013-11-27 MED ORDER — PANTOPRAZOLE SODIUM 40 MG PO TBEC
80.0000 mg | DELAYED_RELEASE_TABLET | Freq: Every day | ORAL | Status: DC
  Administered 2013-11-28 – 2013-12-03 (×6): 80 mg via ORAL
  Filled 2013-11-27 (×6): qty 2

## 2013-11-27 NOTE — H&P (Signed)
[facility] History and Physical  Margaret Adkins QZE:[id number] DOB: [birth year] DOA: 11/27/2013  Referring physician:  PCP: Vikki Ports, MD  Specialists:   Chief Complaint: Chronic increasing SOB  HPI: Margaret Adkins is a 73 y.o. BF PMHx pulmonary fibrosis/interstitial lung disease on home O2 3 L via [place], bronchiectasis, Hepatitis C, diabetes type 2, HTN, HLD, colon polyps.Presents for SOB. A church friend brought her in today. She has hx/o pulmonary fibrosis, on oxygen therapy currently, on daily Symbicort, just saw pulmonology a few weeks ago and says her prognosis has worsened. She is here for dyspnea. Symptoms include rapid breathing, can't catch her breath, already on 3L /oxygen per minute, has been having difficulty the last several days,sore throat, thick productive mucous, but no chest pain, no swelling in legs. Hospitalized for same out of town few months ago. ABG obtained while patient was on a Ventimask, patient treated with a DuoNeb felt better placed back on her home regimen of O2 3 L via [place] did well while sitting in the bed however ambulating SpO2 while on home regimen 02.to mid 80s. Per Dr. Fredia Sorrow (ED physician) consult to pulmonology placed. Patient last saw Dr. Baird Lyons (pulmonologist) on 11/06/2013; per his note felt this was UIP or planning to obtain updated CT and possibly start Perfenadone. For patient's left-sided chest wall pain obtain updated CXR start tramadol for pain, and obtain d-dimer to exclude PE. Which was negative. Patient states that at her last appointment with  Dr. Baird Lyons (pulmonologist) on 11/06/2013; she increased her home O2 to 3 L via [place]/24 hours.    Review of Systems: The patient denies anorexia, fever, weight loss,, vision loss, decreased hearing, hoarseness, chest pain, syncope, peripheral edema, balance deficits, hemoptysis,  melena, hematochezia, severe indigestion/heartburn, hematuria, incontinence, genital sores, muscle weakness,  suspicious skin lesions, transient blindness, difficulty walking, depression, unusual weight change, abnormal bleeding, enlarged lymph nodes, angioedema, and breast masses.    TRAVEL HISTORY:   Procedure CXR 11/27/2013 Low lung volume portable frontal radiograph. Given this limitation,  similar interstitial lung disease (likely usual interstitial  pneumonitis). No evidence of acute superimposed process.  CT chest high-resolution 3/19 2015  1. 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  2. Atherosclerosis, including 2 vessel coronary artery disease.  Assessment for potential risk factor modification, dietary therapy  or pharmacologic therapy may be warranted, if clinically indicated.   Antibiotics   Consultation Pulmonology contacted by ED    Past Medical History  Diagnosis Date  . Colon polyps   . Hepatitis C   . Diverticulosis   . Diabetes mellitus   . Hypertension   . GERD (gastroesophageal reflux disease)   . Osteoporosis   . Hyperlipidemia    Past Surgical History  Procedure Laterality Date  . Cholecystectomy  2004  . Rotator cuff repair      right  . Abdominal hysterectomy      and LSO in 1980's  . Lipoma removal      abdomen  . Breast biopsy      left, benign  . Hemicolectomy      left, for diverticulitis (in [place])   Social History:  reports that she quit smoking about 55 years ago. Her smoking use included Cigarettes. She has a 10 pack-year smoking history. She has never used smokeless tobacco. She reports that she does not drink alcohol or use illicit drugs.   Allergies  Allergen  Reactions  . Codeine Rash    Family History  Problem Relation Age of Onset  . Liver cancer Mother      Prior to Admission medications   Medication Sig Start Date End Date Taking? Authorizing Provider  albuterol (PROVENTIL HFA;VENTOLIN HFA) 108 (90 BASE)  MCG/ACT inhaler Inhale 2 puffs into the lungs every 6 (six) hours as needed for wheezing or shortness of breath. 06/13/13 02/05/15 Yes Deneise Lever, MD  aspirin 81 MG EC tablet Take 81 mg by mouth daily.     Yes Historical Provider, MD  budesonide-formoterol [facility]) 160-4.5 MCG/ACT inhaler 2 puffs then rinse mouth, twice daily 06/13/13  Yes Deneise Lever, MD  diphenhydrAMINE (BENADRYL) 25 mg capsule Take 25 mg by mouth every 6 (six) hours as needed for allergies.    Yes Historical Provider, MD  fish oil-omega-3 fatty acids 1000 MG capsule Take 2 g by mouth daily.   Yes Historical Provider, MD  Multiple Vitamins-Minerals (ALIVE ONCE DAILY WOMENS 50+) TABS Take by mouth.   Yes Historical Provider, MD  Multiple Vitamins-Minerals ([place] PO) Take 1 capsule by mouth daily.   Yes Historical Provider, MD  olmesartan-hydrochlorothiazide (BENICAR HCT) 40-25 MG per tablet Take 1 tablet by mouth daily. 07/04/13  Yes Rita Ohara, MD  omeprazole (PRILOSEC) 40 MG capsule Take 1 capsule (40 mg total) by mouth daily. 05/13/13  Yes Camelia Eng Tysinger, PA-C  pioglitazone-metformin (ACTOPLUS MET) 15-500 MG per tablet Take 1 tablet by mouth 2 (two) times daily with a meal. 05/13/13  Yes Camelia Eng Tysinger, PA-C  pravastatin (PRAVACHOL) 40 MG tablet Take 1 tablet (40 mg total) by mouth daily. 05/13/13  Yes Camelia Eng Tysinger, PA-C  traMADol (ULTRAM) 50 MG tablet Take 50 mg by mouth every 6 (six) hours as needed for moderate pain. 1 or 2 every 6 hours if needed for pain 11/06/13  Yes Deneise Lever, MD  travoprost, benzalkonium, (TRAVATAN) 0.004 % ophthalmic solution Place 1 drop into both eyes at bedtime.     Yes Historical Provider, MD  nitroGLYCERIN (NITROSTAT) 0.4 MG SL tablet Place 1 tablet (0.4 mg total) under the tongue every 5 (five) minutes as needed for chest pain. 02/09/12 11/06/13  Carlena Hurl, PA-C   Physical Exam: Filed Vitals:   11/27/13 1500 11/27/13 1530 11/27/13 1600 11/27/13 1632  BP: 121/73 118/65  118/70 111/55  Pulse: 101 80 78 76  Temp:      TempSrc:      Resp: _0 Height:      Weight:      SpO2: 100% 100% 100% 100%     General:  A./O. x4, NAD  Eyes: Pupils equal react to light and accommodation  Neck: Negative JVD, negative lymphadenopathy  Cardiovascular: Tachycardic, regular rhythm, negative murmurs rubs gallops  Respiratory: Extremely poor air movement in all lung fields, mild crackles left upper lobe  Abdomen: Soft, nontender, nondistended, plus bowel sound  Musculoskeletal: Negative duodenum  Neurologic: Cranial nerves II through XII intact, extremity strength 5/5, sensation intact throughout  Labs on Admission:  Basic Metabolic Panel:  Recent Labs Lab 11/27/13 1231  NA 141  K 4.3  CL 95*  CO2 24  GLUCOSE 87  BUN 13  CREATININE 0.83  CALCIUM 10.1   Liver Function Tests:  Recent Labs Lab 11/27/13 1231  AST 33  ALT 23  ALKPHOS 72  BILITOT 0.5  PROT 8.6*  ALBUMIN 4.3    Recent Labs Lab 11/27/13 1231  LIPASE  57   No results found for this basename: AMMONIA,  in the last 168 hours CBC:  Recent Labs Lab 11/27/13 1231  WBC 7.9  NEUTROABS 4.8  HGB 13.3  HCT 40.7  MCV 90.0  PLT 185   Cardiac Enzymes: No results found for this basename: CKTOTAL, CKMB, CKMBINDEX, TROPONINI,  in the last 168 hours  BNP (last 3 results)  Recent Labs  11/27/13 1231  PROBNP 287.7*   CBG: No results found for this basename: GLUCAP,  in the last 168 hours  Radiological Exams on Admission: Dg Chest 2 View  11/27/2013   CLINICAL DATA:  Short of breath. Weakness. Hypertension and diabetic.  EXAM: CHEST  2 VIEW  COMPARISON:  CT CHEST HIGH RESOLUTION W/O CM dated 11/19/2013; DG CHEST 2 VIEW dated 11/06/2013  FINDINGS: Diminished lung volumes on the frontal radiograph, secondary to AP portable technique. Cardiomegaly accentuated by AP portable technique. No definite pleural fluid. Given differences in lung volumes, similar appearance of  interstitial lung disease. No acute superimposed airspace consolidation. No congestive failure. Cholecystectomy clips.  IMPRESSION: Low lung volume portable frontal radiograph. 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 No evidence of acute superimposed process.   Electronically Signed   By: Abigail Miyamoto M.D.   On: 11/27/2013 12:45    EKG: Pending   Assessment/Plan Principal Problem:   UIP (usual interstitial pneumonitis) Active Problems:   Type II or unspecified type diabetes mellitus without mention of complication, not stated as uncontrolled   Essential hypertension, benign   Pure hypercholesterolemia   GERD (gastroesophageal reflux disease)   Pulmonary interstitial fibrosis   Left-sided chest wall pain   Unspecified chronic bronchitis   Chest pain on breathing   UIP -Although the community is split on efficacy since patient is having a significant decline in respiratory function will start prednisolone 40 mg IV daily, if effective would convert over to PO medication  -Will await pulmonology input for further treatment options such as additional immunosuppressant -Patient has not had any signs or symptoms of true infection, negative fever, negative chills or shakes. Has had positive productive sputum (thick white) which would be consistent with her increasing UIP. Will hold on antibiotics/antiviral however if patient spikes a fever or infective leukocytosis would immediately start  Pulmonary interstitial fibrosis -See UIP  Chronic bronchitis -See UIP  HTN -Within AHA guidelines -Continue home medication  HLD -Zocor 20 mg  Diabetes type 2 uncontrolled -Place on moderate SSI secondary to starting steroids  Abdominal pain -Patient states RLQ pain is chronic which is helped up by her tramadol, continue home tramadol -Start Zofran IV prior to administration her tramadol secondary to nausea     Code Status: DNR    Family Communication: Church member/friend present Disposition Plan: Per pulmonology   Time spent: 90 minutes   Allie Bossier [facility] Pager [phone]  If 7PM-7AM, please contact night-coverage [email or web address] Password [facility] 11/27/2013, 6:04 PM

## 2013-11-27 NOTE — ED Notes (Signed)
Heart Healthy diet ordered spoke with Gerald Stabs

## 2013-11-27 NOTE — ED Notes (Signed)
Communication with Lab about CMET and Lipase. 10-15 mins for results

## 2013-11-27 NOTE — ED Provider Notes (Signed)
CSN: [id number]     Arrival date & time 11/27/13  1135 History   First MD Initiated Contact with Patient 11/27/13 1140     Chief Complaint  Patient presents with  . Shortness of Breath     (Consider location/radiation/quality/duration/timing/severity/associated sxs/prior Treatment) The history is provided by the patient.   73 year old female from Dr. this is a history of pulmonary fibrosis. Patient normally on 3 L of oxygen at all times. Any of this she was satting 70% range while on her oxygen. Patient went there for shortness of breath and a little bit of a cough that started yesterday. No fevers no chest pain no leg swelling. Patient is also followed by Dr. Annamaria Boots from pulmonology. EMS was called they started on her percent nonrebreather which brought her sats back up to 100%. Patient arrived here. Patient still was breathing hard upon arrival to spike sats being normal. Patient uses Symbicort and albuterol.  Past Medical History  Diagnosis Date  . Colon polyps   . Hepatitis C   . Diverticulosis   . Diabetes mellitus   . Hypertension   . GERD (gastroesophageal reflux disease)   . Osteoporosis   . Hyperlipidemia    Past Surgical History  Procedure Laterality Date  . Cholecystectomy  2004  . Rotator cuff repair      right  . Abdominal hysterectomy      and LSO in 1980's  . Lipoma removal      abdomen  . Breast biopsy      left, benign  . Hemicolectomy      left, for diverticulitis (in [place])   Family History  Problem Relation Age of Onset  . Liver cancer Mother    History  Substance Use Topics  . Smoking status: Former Smoker -- 2.00 packs/day for 5 years    Types: Cigarettes    Quit date: 09/05/1958  . Smokeless tobacco: Never Used  . Alcohol Use: No     Comment: past use, quit in 1992   OB History   Grav Para Term Preterm Abortions TAB SAB Ect Mult Living                 Review of Systems  Constitutional: Negative for fever.  HENT: Negative for congestion.    Respiratory: Positive for cough and shortness of breath.   Cardiovascular: Negative for chest pain.  Gastrointestinal: Negative for abdominal pain.  Genitourinary: Negative for dysuria.  Musculoskeletal: Negative for myalgias.  Skin: Negative for rash.  Neurological: Negative for headaches.  Hematological: Does not bruise/bleed easily.  Psychiatric/Behavioral: Negative for confusion.      Allergies  Codeine  Home Medications   Current Outpatient Rx  Name  Route  Sig  Dispense  Refill  . albuterol (PROVENTIL HFA;VENTOLIN HFA) 108 (90 BASE) MCG/ACT inhaler   Inhalation   Inhale 2 puffs into the lungs every 6 (six) hours as needed for wheezing or shortness of breath.   18 g   prn   . aspirin 81 MG EC tablet   Oral   Take 81 mg by mouth daily.           . budesonide-formoterol (SYMBICORT) 160-4.5 MCG/ACT inhaler      2 puffs then rinse mouth, twice daily   1 Inhaler   prn   . diphenhydrAMINE (BENADRYL) 25 mg capsule   Oral   Take 25 mg by mouth every 6 (six) hours as needed for allergies.          Marland Kitchen  fish oil-omega-3 fatty acids 1000 MG capsule   Oral   Take 2 g by mouth daily.         . Multiple Vitamins-Minerals (ALIVE ONCE DAILY WOMENS 50+) TABS   Oral   Take by mouth.         . Multiple Vitamins-Minerals ([facility] MACULAR HEALTH PO)   Oral   Take 1 capsule by mouth daily.         Marland Kitchen olmesartan-hydrochlorothiazide (BENICAR HCT) 40-25 MG per tablet   Oral   Take 1 tablet by mouth daily.   90 tablet   0   . omeprazole (PRILOSEC) 40 MG capsule   Oral   Take 1 capsule (40 mg total) by mouth daily.   90 capsule   2   . pioglitazone-metformin (ACTOPLUS MET) 15-500 MG per tablet   Oral   Take 1 tablet by mouth 2 (two) times daily with a meal.   180 tablet   2   . pravastatin (PRAVACHOL) 40 MG tablet   Oral   Take 1 tablet (40 mg total) by mouth daily.   90 tablet   2   . traMADol (ULTRAM) 50 MG tablet   Oral   Take 50 mg by mouth every 6 (six)  hours as needed for moderate pain. 1 or 2 every 6 hours if needed for pain         . travoprost, benzalkonium, (TRAVATAN) 0.004 % ophthalmic solution   Both Eyes   Place 1 drop into both eyes at bedtime.           Marland Kitchen EXPIRED: nitroGLYCERIN (NITROSTAT) 0.4 MG SL tablet   Sublingual   Place 1 tablet (0.4 mg total) under the tongue every 5 (five) minutes as needed for chest pain.   25 tablet   0    BP 111/55  Pulse 76  Temp(Src) 97.9 F (36.6 C) (Oral)  Resp 24  Ht $R'5\' 3"'ko$  (1.6 m)  Wt 152 lb (68.947 kg)  BMI 26.93 kg/m2  SpO2 100% Physical Exam  Nursing note and vitals reviewed. Constitutional: She is oriented to person, place, and time. She appears well-developed and well-nourished. She appears distressed.  HENT:  Head: Normocephalic and atraumatic.  Mouth/Throat: Oropharynx is clear and moist.  Eyes: Conjunctivae and EOM are normal. Pupils are equal, round, and reactive to light.  Neck: Normal range of motion. Neck supple.  Cardiovascular: Normal rate, regular rhythm and normal heart sounds.   No murmur heard. Pulmonary/Chest: Breath sounds normal. She is in respiratory distress. She has no wheezes. She has no rales.  Abdominal: Soft. Bowel sounds are normal. There is no tenderness.  Musculoskeletal: Normal range of motion.  Neurological: She is alert and oriented to person, place, and time. No cranial nerve deficit. She exhibits normal muscle tone. Coordination normal.  Skin: Skin is warm. No rash noted.    ED Course  Procedures (including critical care time) Labs Review Labs Reviewed  COMPREHENSIVE METABOLIC PANEL - Abnormal; Notable for the following:    Chloride 95 (*)    Total Protein 8.6 (*)    GFR calc non Af Amer 69 (*)    GFR calc Af Amer 80 (*)    All other components within normal limits  PRO B NATRIURETIC PEPTIDE - Abnormal; Notable for the following:    Pro B Natriuretic peptide (BNP) 287.7 (*)    All other components within normal limits  I-STAT  ARTERIAL BLOOD GAS, ED - Abnormal; Notable for the following:  pCO2 arterial 46.7 (*)    pO2, Arterial 27.0 (*)    Bicarbonate 29.5 (*)    Acid-Base Excess 4.0 (*)    All other components within normal limits  I-STAT ARTERIAL BLOOD GAS, ED - Abnormal; Notable for the following:    pCO2 arterial 45.7 (*)    pO2, Arterial 275.0 (*)    Bicarbonate 29.7 (*)    Acid-Base Excess 4.0 (*)    All other components within normal limits  LIPASE, BLOOD  CBC WITH DIFFERENTIAL  I-STAT CG4 LACTIC ACID, ED   Results for orders placed during the hospital encounter of 11/27/13  COMPREHENSIVE METABOLIC PANEL      Result Value Ref Range   Sodium 141  137 - 147 mEq/L   Potassium 4.3  3.7 - 5.3 mEq/L   Chloride 95 (*) 96 - 112 mEq/L   CO2 24  19 - 32 mEq/L   Glucose, Bld 87  70 - 99 mg/dL   BUN 13  6 - 23 mg/dL   Creatinine, Ser 0.83  0.50 - 1.10 mg/dL   Calcium 10.1  8.4 - 10.5 mg/dL   Total Protein 8.6 (*) 6.0 - 8.3 g/dL   Albumin 4.3  3.5 - 5.2 g/dL   AST 33  0 - 37 U/L   ALT 23  0 - 35 U/L   Alkaline Phosphatase 72  39 - 117 U/L   Total Bilirubin 0.5  0.3 - 1.2 mg/dL   GFR calc non Af Amer 69 (*) >90 mL/min   GFR calc Af Amer 80 (*) >90 mL/min  LIPASE, BLOOD      Result Value Ref Range   Lipase 57  11 - 59 U/L  CBC WITH DIFFERENTIAL      Result Value Ref Range   WBC 7.9  4.0 - 10.5 K/uL   RBC 4.52  3.87 - 5.11 MIL/uL   Hemoglobin 13.3  12.0 - 15.0 g/dL   HCT 40.7  36.0 - 46.0 %   MCV 90.0  78.0 - 100.0 fL   MCH 29.4  26.0 - 34.0 pg   MCHC 32.7  30.0 - 36.0 g/dL   RDW 13.5  11.5 - 15.5 %   Platelets 185  150 - 400 K/uL   Neutrophils Relative % 61  43 - 77 %   Neutro Abs 4.8  1.7 - 7.7 K/uL   Lymphocytes Relative 32  12 - 46 %   Lymphs Abs 2.5  0.7 - 4.0 K/uL   Monocytes Relative 5  3 - 12 %   Monocytes Absolute 0.4  0.1 - 1.0 K/uL   Eosinophils Relative 1  0 - 5 %   Eosinophils Absolute 0.1  0.0 - 0.7 K/uL   Basophils Relative 0  0 - 1 %   Basophils Absolute 0.0  0.0 - 0.1  K/uL  PRO B NATRIURETIC PEPTIDE      Result Value Ref Range   Pro B Natriuretic peptide (BNP) 287.7 (*) 0 - 125 pg/mL  I-STAT CG4 LACTIC ACID, ED      Result Value Ref Range   Lactic Acid, Venous 0.90  0.5 - 2.2 mmol/L  I-STAT ARTERIAL BLOOD GAS, ED      Result Value Ref Range   pH, Arterial 7.407  7.350 - 7.450   pCO2 arterial 46.7 (*) 35.0 - 45.0 mmHg   pO2, Arterial 27.0 (*) 80.0 - 100.0 mmHg   Bicarbonate 29.5 (*) 20.0 - 24.0 mEq/L   TCO2 31  0 -  100 mmol/L   O2 Saturation 53.0     Acid-Base Excess 4.0 (*) 0.0 - 2.0 mmol/L   Patient temperature 97.9 F     Collection site RADIAL, ALLEN'S TEST ACCEPTABLE     Drawn by Operator     Sample type ARTERIAL     Comment MD NOTIFIED, SUGGEST RECOLLECT    I-STAT ARTERIAL BLOOD GAS, ED      Result Value Ref Range   pH, Arterial 7.420  7.350 - 7.450   pCO2 arterial 45.7 (*) 35.0 - 45.0 mmHg   pO2, Arterial 275.0 (*) 80.0 - 100.0 mmHg   Bicarbonate 29.7 (*) 20.0 - 24.0 mEq/L   TCO2 31  0 - 100 mmol/L   O2 Saturation 100.0     Acid-Base Excess 4.0 (*) 0.0 - 2.0 mmol/L   Patient temperature 97.9 F     Collection site RADIAL, ALLEN'S TEST ACCEPTABLE     Drawn by Operator     Sample type ARTERIAL      Imaging Review Dg Chest 2 View  11/27/2013   CLINICAL DATA:  Short of breath. Weakness. Hypertension and diabetic.  EXAM: CHEST  2 VIEW  COMPARISON:  CT CHEST HIGH RESOLUTION W/O CM dated 11/19/2013; DG CHEST 2 VIEW dated 11/06/2013  FINDINGS: Diminished lung volumes on the frontal radiograph, secondary to AP portable technique. Cardiomegaly accentuated by AP portable technique. No definite pleural fluid. Given differences in lung volumes, similar appearance of interstitial lung disease. No acute superimposed airspace consolidation. No congestive failure. Cholecystectomy clips.  IMPRESSION: Low lung volume portable frontal radiograph. 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 No evidence of  acute superimposed process.   Electronically Signed   By: Abigail Miyamoto M.D.   On: 11/27/2013 12:45     EKG Interpretation None       Date: 11/27/2013  Rate: 67  Rhythm: normal sinus rhythm  QRS Axis: normal  Intervals: normal  ST/T Wave abnormalities: nonspecific T wave changes  Conduction Disutrbances:none  Narrative Interpretation:   Old EKG Reviewed: none available Did not crossover in MUSE    MDM   Final diagnoses:  Pulmonary interstitial fibrosis  Shortness of breath   The patient is followed by Salli Real has a primary care Dr. She was at her office today was noted to be short of breath and on her normal amount of free liters of oxygen was T. satting down to 70%. Patient brought in by EMS. Patient has known pulmonary fibrosis. Patient's followed by Dr. Annamaria Boots from pulmonology. Patient is normally on 3 L of oxygen at all times. The shortness of breath started to get worse yesterday associated with a cough occasionally productive. No fever no chest pain no leg swelling. Patient's currently not on oral steroids. The patient is on Symbicort as well as Proventil inhaler. Patient was changed by EMS to 100% nonrebreather and was satting 100% when she arrived.  Patient remained short of breath while here until she was given a albuterol Atrovent neb. Which made her feel much better. Patient had a blood gas done which showed normal pH PCO2 and more than adequate PO2. Blood gas was done while she was on the Ventimask. Following the nebulizer treatment since patient felt much better she was put back on her 3 L of nasal cannula oxygen and her sats were 100%. Patient workup otherwise was negative no elevation lactic acid no leukocytosis patient never had any chest pain chest x-rays negative for pneumonia pneumothorax pulmonary edema.  So patient was ambulated patient initially tolerated this fine but then started very short of breath and and dropped her sats down into the 80s. Patient was  then put back in bed has now returned to a comfortable full level of breathing and saturations.  Discussed with the hospitalist team they will admit and also consultation is out for pulmonology.    Mervin Kung, MD 11/27/13 [phone]

## 2013-11-27 NOTE — ED Notes (Signed)
MD at bedside. 

## 2013-11-27 NOTE — ED Notes (Signed)
Ambulate pt.

## 2013-11-27 NOTE — ED Notes (Signed)
MD at bedside. Admitting  

## 2013-11-27 NOTE — ED Notes (Signed)
Results of lactic acid shown to nurse Doroteo Bradford on [place]

## 2013-11-27 NOTE — Progress Notes (Addendum)
Subjective:  Margaret Adkins is a 73 y.o. female who presents for SOB.  A church friend brought her in today.   She has hx/o pulmonary fibrosis, on oxygen therapy currently, on daily Symbicort, just saw pulmonology a few weeks ago and says her prognosis has worsened.  She is here for dyspnea.  Symptoms include rapid breathing, can't catch her breath, already on 3L /oxygen per minute, has been having difficulty the last several days,sore throat, thick productive mucous, but no chest pain, no swelling in legs.  No other aggravating or relieving factors.  hospitalized for same out of town few months ago.  No other c/o.   The following portions of the patient's history were reviewed and updated as appropriate: allergies, current medications, past family history, past medical history, past social history, past surgical history and problem list.  ROS as in subjective  Past Medical History  Diagnosis Date  . Colon polyps   . Hepatitis C   . Diverticulosis   . Diabetes mellitus   . Hypertension   . GERD (gastroesophageal reflux disease)   . Osteoporosis   . Hyperlipidemia      Objective: BP 152/80  Pulse 82  Temp(Src) 97.3 F (36.3 C) (Oral)  Resp 40  Wt 150 lb (68.04 kg)  SpO2 92%   General appearance: Alert, WD/WN, +respiratory distress                             Skin: warm, no rash, no diaphoresis                           Head: no sinus tenderness                            Eyes: conjunctiva normal, corneas clear, PERRLA                            Ears: pearly TMs, external ear canals normal             Mouth/throat: somewhat dry,no obvious pharyngeal erythema                           Neck: supple, no adenopathy, no thyromegaly, nontender                          Heart: tachycardic about 100, murmur heard, but given tachypnea, hard to hear clearly                         Lungs: +tachypnea about 40 breaths per minutes, decrease breath sounds, on 3L/min oxygen portable, pulse ox  90%,no wheezes, no rales                Extremities: no edema, nontender     High resolution chest CT 11/11/13: IMPRESSION:  1. 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  2. Atherosclerosis, including 2 vessel coronary artery disease.  Assessment for potential risk factor modification, dietary therapy  or pharmacologic therapy may be warranted, if clinically indicated.     Assessment: Encounter Diagnoses  Name Primary?  . Pulmonary fibrosis Yes  . Dyspnea   . Sore throat   .  Tachypnea   . Hypoxemia     Plan:  Nurse came and got me due to vitals and presentation.  After brief history and exam EMS called for transport to [facility].  Reviewed the early march pulmonology visit, labs and CT chest.    EMS transported to Emergency Dept for dyspnea, hypoxemia, tachypnea, hx/o worsening progressive pulmonary fibrosis.

## 2013-11-27 NOTE — ED Notes (Signed)
Pt.was ambulate in front of nurse station sats started droppin in 8902. Hr-105.after slowing down pt.sats started to pick up.99%o2 hr.-98

## 2013-11-27 NOTE — ED Notes (Addendum)
73 yo female from PCP via [facility] with SOB since yesterday around noon. At the Drs. Office SAT 89% on 4L now 100% on Non-rebreather, no wheezes but Tachypnic. No c/o chest pain just a sore throat. A/O x4. HX of Pulm Fibrosis and DM . 12 Lead EKG NSR.

## 2013-11-27 NOTE — ED Notes (Signed)
EDP notified of Lactic Acid result called from Lab

## 2013-11-27 NOTE — ED Notes (Addendum)
Venturi Mask Decreased to 30% 4LPM per Respiratory

## 2013-11-27 NOTE — Progress Notes (Signed)
  Pt admitted to the unit. Pt is stable, alert and oriented per baseline. Oriented to room, staff, and call bell. Educated to call for any assistance. Bed in lowest position, call bell within reach- will continue to monitor. 

## 2013-11-28 DIAGNOSIS — R0602 Shortness of breath: Secondary | ICD-10-CM

## 2013-11-28 DIAGNOSIS — R071 Chest pain on breathing: Secondary | ICD-10-CM

## 2013-11-28 DIAGNOSIS — J8410 Pulmonary fibrosis, unspecified: Secondary | ICD-10-CM

## 2013-11-28 LAB — TROPONIN I

## 2013-11-28 LAB — CBC WITH DIFFERENTIAL/PLATELET
Basophils Absolute: 0 10*3/uL (ref 0.0–0.1)
Basophils Relative: 0 % (ref 0–1)
EOS ABS: 0 10*3/uL (ref 0.0–0.7)
EOS PCT: 0 % (ref 0–5)
HEMATOCRIT: 33.7 % — AB (ref 36.0–46.0)
Hemoglobin: 10.9 g/dL — ABNORMAL LOW (ref 12.0–15.0)
LYMPHS ABS: 0.9 10*3/uL (ref 0.7–4.0)
Lymphocytes Relative: 17 % (ref 12–46)
MCH: 29.1 pg (ref 26.0–34.0)
MCHC: 32.3 g/dL (ref 30.0–36.0)
MCV: 90.1 fL (ref 78.0–100.0)
MONO ABS: 0.2 10*3/uL (ref 0.1–1.0)
Monocytes Relative: 3 % (ref 3–12)
Neutro Abs: 4.6 10*3/uL (ref 1.7–7.7)
Neutrophils Relative %: 81 % — ABNORMAL HIGH (ref 43–77)
PLATELETS: 188 10*3/uL (ref 150–400)
RBC: 3.74 MIL/uL — ABNORMAL LOW (ref 3.87–5.11)
RDW: 13.5 % (ref 11.5–15.5)
WBC: 5.6 10*3/uL (ref 4.0–10.5)

## 2013-11-28 LAB — COMPREHENSIVE METABOLIC PANEL
ALBUMIN: 3.4 g/dL — AB (ref 3.5–5.2)
ALT: 18 U/L (ref 0–35)
AST: 24 U/L (ref 0–37)
Alkaline Phosphatase: 58 U/L (ref 39–117)
BILIRUBIN TOTAL: 0.5 mg/dL (ref 0.3–1.2)
BUN: 16 mg/dL (ref 6–23)
CHLORIDE: 101 meq/L (ref 96–112)
CO2: 27 meq/L (ref 19–32)
Calcium: 9.1 mg/dL (ref 8.4–10.5)
Creatinine, Ser: 0.76 mg/dL (ref 0.50–1.10)
GFR calc Af Amer: >90 mL/min (ref >90)
GFR, EST NON AFRICAN AMERICAN: 82 mL/min — AB (ref >90)
Glucose, Bld: 146 mg/dL — ABNORMAL HIGH (ref 70–99)
Potassium: 4.4 mEq/L (ref 3.7–5.3)
Sodium: 140 mEq/L (ref 137–147)
Total Protein: 7 g/dL (ref 6.0–8.3)

## 2013-11-28 LAB — GLUCOSE, CAPILLARY
Glucose-Capillary: 138 mg/dL — ABNORMAL HIGH (ref 70–99)
Glucose-Capillary: 125 mg/dL — ABNORMAL HIGH (ref 70–99)
Glucose-Capillary: 143 mg/dL — ABNORMAL HIGH (ref 70–99)
Glucose-Capillary: 157 mg/dL — ABNORMAL HIGH (ref 70–99)

## 2013-11-28 LAB — INFLUENZA PANEL BY PCR (TYPE A & B)
H1N1FLUPCR: NOT DETECTED
INFLAPCR: NEGATIVE
INFLBPCR: NEGATIVE

## 2013-11-28 LAB — HEMOGLOBIN A1C
Hgb A1c MFr Bld: 6.4 % — ABNORMAL HIGH (ref <5.7)
Mean Plasma Glucose: 137 mg/dL — ABNORMAL HIGH (ref <117)

## 2013-11-28 LAB — MAGNESIUM: Magnesium: 1.2 mg/dL — ABNORMAL LOW (ref 1.5–2.5)

## 2013-11-28 MED ORDER — LEVALBUTEROL HCL 0.63 MG/3ML IN NEBU
0.6300 mg | INHALATION_SOLUTION | RESPIRATORY_TRACT | Status: DC | PRN
  Administered 2013-11-28 – 2013-11-29 (×2): 0.63 mg via RESPIRATORY_TRACT
  Filled 2013-11-28 (×2): qty 3

## 2013-11-28 MED ORDER — ADULT MULTIVITAMIN W/MINERALS CH
1.0000 | ORAL_TABLET | Freq: Every day | ORAL | Status: DC
  Administered 2013-11-28 – 2013-12-03 (×6): 1 via ORAL
  Filled 2013-11-28 (×6): qty 1

## 2013-11-28 NOTE — Progress Notes (Signed)
INITIAL NUTRITION ASSESSMENT  DOCUMENTATION CODES Per approved criteria  -Not Applicable   INTERVENTION: Encouraged adequate po intake.  Add MVI. Pt declined further nutrition interventions at this time. RD to continue to follow nutrition care plan.  NUTRITION DIAGNOSIS: Increased nutrient needs related to acute illness as evidenced by estimated needs.   Goal: Intake to meet >90% of estimated nutrition needs.  Monitor:  weight trends, lab trends, I/O's, PO intake, supplement tolerance  Reason for Assessment: Malnutrition Screening Tool  73 y.o. female  Admitting Dx: UIP (usual interstitial pneumonitis)  ASSESSMENT: PMHx significant for pulmonary fibrosis, interstitial lung disease, Hep C, DM2, HTN, HLD, colon polyps. Admitted with dyspnea. 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  PCCM evaluated pt this morning, plan to not follow, does not appear to have active infection, afebrile, no leukocytosis, no purulent sputum.  Pt with non-significant wt loss of about 8.5% over a year's time frame. We discussed her weight loss and need for adequate calories to support labored breathing. Pt states that she understands, and snacks throughout the day at home. Says she has an adequate appetite and this time and declines oral nutrition supplements.  Pt is at nutrition risk 2/2 weight loss and chronic disease.  Height: Ht Readings from Last 1 Encounters:  11/27/13 5\' 3"  (1.6 m)    Weight: Wt Readings from Last 1 Encounters:  11/27/13 151 lb 10.8 oz (68.8 kg)    Ideal Body Weight: 115 lb  % Ideal Body Weight: 131%  Wt Readings from Last 10 Encounters:  11/27/13 151 lb 10.8 oz (68.8 kg)  11/27/13 150 lb (68.04 kg)  11/06/13 154 lb 9.6 oz (70.126 kg)  06/13/13 160 lb (72.576 kg)  05/13/13 161 lb (73.029 kg)  01/21/13 170 lb (77.111 kg)  12/13/12 168 lb (76.204 kg)  11/30/12 174 lb (78.926 kg)  11/06/12 166 lb 12.8 oz (75.66 kg)  10/04/12 165 lb 12.8 oz (75.206 kg)     Usual Body Weight: 165 lb  % Usual Body Weight: 91.5%  BMI:  Body mass index is 26.88 kg/(m^2). WNL  Estimated Nutritional Needs: Kcal: 1600 - 1800 Protein: 85 - 100 g Fluid: 1.6 - 1.8 liters  Skin: intact  Diet Order: Carb Control  EDUCATION NEEDS: -No education needs identified at this time   Intake/Output Summary (Last 24 hours) at 11/28/13 1037 Last data filed at 11/27/13 1936  Gross per 24 hour  Intake      0 ml  Output    175 ml  Net   -175 ml    Last BM: 3/25  Labs:   Recent Labs Lab 11/27/13 1231 11/27/13 1730 11/28/13 0700  NA 141  --  140  K 4.3  --  4.4  CL 95*  --  101  CO2 24  --  27  BUN 13  --  16  CREATININE 0.83 0.83 0.76  CALCIUM 10.1  --  9.1  MG  --   --  1.2*  GLUCOSE 87  --  146*    CBG (last 3)   Recent Labs  11/27/13 2208  GLUCAP 138*    Scheduled Meds: . aspirin EC  81 mg Oral Daily  . budesonide-formoterol  2 puff Inhalation BID  . enoxaparin (LOVENOX) injection  40 mg Subcutaneous Q24H  . fish oil-omega-3 fatty acids  2 g Oral Daily  . irbesartan  300 mg Oral Daily   And  . hydrochlorothiazide  25 mg Oral Daily  . methylPREDNISolone (SOLU-MEDROL) injection  40 mg  Intravenous Q24H  . pantoprazole  80 mg Oral Daily  . simvastatin  20 mg Oral q1800  . travoprost (benzalkonium)  1 drop Both Eyes QHS    Continuous Infusions: . sodium chloride 100 mL/hr at 11/28/13 0142  . sodium chloride 75 mL/hr at 11/27/13 1748    Past Medical History  Diagnosis Date  . Colon polyps   . Hepatitis C   . Diverticulosis   . Diabetes mellitus   . Hypertension   . GERD (gastroesophageal reflux disease)   . Osteoporosis   . Hyperlipidemia     Past Surgical History  Procedure Laterality Date  . Cholecystectomy  2004  . Rotator cuff repair      right  . Abdominal hysterectomy      and LSO in 1980's  . Lipoma removal      abdomen  . Breast biopsy      left, benign  . Hemicolectomy      left, for diverticulitis (in  [place])    Inda Coke MS, RD, LDN Inpatient Registered Dietitian Pager: [phone] After-hours pager: [phone]

## 2013-11-28 NOTE — Progress Notes (Signed)
[facility] PROGRESS NOTE  Margaret Adkins YPP:[id number] DOB: 09/21/1940 DOA: 11/27/2013 PCP: Vikki Ports, MD  Assessment/Plan:  UIP  -Although the community is split on efficacy since patient is having a significant decline in respiratory function will start prednisolone 40 mg IV daily, if effective would convert over to PO medication  -Will await pulmonology input for further treatment options such as additional immunosuppressant  -Patient has not had any signs or symptoms of true infection, negative fever, negative chills or shakes. Has had positive productive sputum (thick white) which would be consistent with her increasing UIP. Will hold on antibiotics/antiviral however if patient spikes a fever or infective leukocytosis would immediately start  -will await pulmonology decision on starting Pirfenidone -continue patient at 4 L O2 via [place], titrate to maintain SpO2> 93%  Pulmonary interstitial fibrosis  -See UIP   Chronic bronchitis  -See UIP   HTN  -Within AHA guidelines  -Continue home medication   HLD  -Zocor 20 mg   Diabetes type 2 uncontrolled  -Place on moderate SSI secondary to starting steroids   Abdominal pain  -Patient states RLQ pain is chronic which is helped up by her tramadol, continue home tramadol  -Start Zofran IV prior to administration her tramadol secondary to nausea     Code Status: DNR  Family Communication: none  Disposition Plan: Per pulmonology   Procedure  CXR 11/27/2013  Low lung volume portable frontal radiograph. Given this limitation,  similar interstitial lung disease (likely usual interstitial  pneumonitis). No evidence of acute superimposed process.   CT chest high-resolution 3/19 2015  1. 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  2. Atherosclerosis, including 2 vessel coronary artery disease.  Assessment for  potential risk factor modification, dietary therapy  or pharmacologic therapy may be warranted, if clinically indicated.   Antibiotics     Consultation  Dr.Wesam Nelda [place]se[place] (Pulmonology)     HPI/Subjective: Margaret Adkins is a 73 y.o. BF PMHx pulmonary fibrosis/interstitial lung disease on home O2 3 L via [place], bronchiectasis, Hepatitis C, diabetes type 2, HTN, HLD, colon polyps.Presents for SOB. A church friend brought her in today. She has hx/o pulmonary fibrosis, on oxygen therapy currently, on daily Symbicort, just saw pulmonology a few weeks ago and says her prognosis has worsened. She is here for dyspnea. Symptoms include rapid breathing, can't catch her breath, already on 3L /oxygen per minute, has been having difficulty the last several days,sore throat, thick productive mucous, but no chest pain, no swelling in legs. Hospitalized for same out of town few months ago. ABG obtained while patient was on a Ventimask, patient treated with a DuoNeb felt better placed back on her home regimen of O2 3 L via [place] did well while sitting in the bed however ambulating SpO2 while on home regimen 02.to mid 80s. Per Dr. Fredia Sorrow (ED physician) consult to pulmonology placed. Patient last saw Dr. Baird Lyons (pulmonologist) on 11/06/2013; per his note felt this was UIP or planning to obtain updated CT and possibly start Perfenadone. For patient's left-sided chest wall pain obtain updated CXR start tramadol for pain, and obtain d-dimer to exclude PE. Which was negative. Patient states that at her last appointment with Dr. Baird Lyons (pulmonologist) on 11/06/2013; she increased her home O2 to 3 L via [place]/24 hours.3/26 patient sitting comfortably eating breakfast on 4 L O2 via [place]   Objective: Filed Vitals:   11/28/13 0300 11/28/13  0505 11/28/13 0747 11/28/13 1300  BP:  126/73  127/74  Pulse: 71 76  79  Temp:  98 F (36.7 C)  98.4 F (36.9 C)  TempSrc:  Oral  Oral  Resp: 18 18  18   Height:       Weight:      SpO2: 99% 100% 99% 100%    Intake/Output Summary (Last 24 hours) at 11/28/13 2047 Last data filed at 11/28/13 1900  Gross per 24 hour  Intake   1866 ml  Output      0 ml  Net   1866 ml   Filed Weights   11/27/13 1140 11/27/13 2053  Weight: 68.947 kg (152 lb) 68.8 kg (151 lb 10.8 oz)    Exam: General: A./O. x4, NAD  Neck: Negative JVD, negative lymphadenopathy  Cardiovascular: Tachycardic, regular rhythm, negative murmurs rubs gallops  Respiratory: improved air movement in all lung fields over 3/25, mild crackles left upper lobe  Abdomen: Soft, nontender, nondistended, plus bowel sound  Musculoskeletal: Negative pedal edema       Data Reviewed: Basic Metabolic Panel:  Recent Labs Lab 11/27/13 1231 11/27/13 1730 11/28/13 0700  NA 141  --  140  K 4.3  --  4.4  CL 95*  --  101  CO2 24  --  27  GLUCOSE 87  --  146*  BUN 13  --  16  CREATININE 0.83 0.83 0.76  CALCIUM 10.1  --  9.1  MG  --   --  1.2*   Liver Function Tests:  Recent Labs Lab 11/27/13 1231 11/28/13 0700  AST 33 24  ALT 23 18  ALKPHOS 72 58  BILITOT 0.5 0.5  PROT 8.6* 7.0  ALBUMIN 4.3 3.4*    Recent Labs Lab 11/27/13 1231  LIPASE 57   No results found for this basename: AMMONIA,  in the last 168 hours CBC:  Recent Labs Lab 11/27/13 1231 11/27/13 1730 11/28/13 0700  WBC 7.9 6.3 5.6  NEUTROABS 4.8  --  4.6  HGB 13.3 11.5* 10.9*  HCT 40.7 36.0 33.7*  MCV 90.0 90.9 90.1  PLT 185 179 188   Cardiac Enzymes:  Recent Labs Lab 11/27/13 1730 11/27/13 2350  TROPONINI <0.30 <0.30   BNP (last 3 results)  Recent Labs  11/27/13 1231 11/27/13 1730  PROBNP 287.7* 226.3*   CBG:  Recent Labs Lab 11/27/13 2208 11/28/13 0750 11/28/13 1151 11/28/13 1656  GLUCAP 138* 138* 125* 143*    No results found for this or any previous visit (from the past 240 hour(s)).   Studies: Dg Chest 2 View  11/27/2013   CLINICAL DATA:  Short of breath. Weakness. Hypertension  and diabetic.  EXAM: CHEST  2 VIEW  COMPARISON:  CT CHEST HIGH RESOLUTION W/O CM dated 11/19/2013; DG CHEST 2 VIEW dated 11/06/2013  FINDINGS: Diminished lung volumes on the frontal radiograph, secondary to AP portable technique. Cardiomegaly accentuated by AP portable technique. No definite pleural fluid. Given differences in lung volumes, similar appearance of interstitial lung disease. No acute superimposed airspace consolidation. No congestive failure. Cholecystectomy clips.  IMPRESSION: Low lung volume portable frontal radiograph. 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 No evidence of acute superimposed process.   Electronically Signed   By: Abigail Miyamoto M.D.   On: 11/27/2013 12:45    Scheduled Meds: . aspirin EC  81 mg Oral Daily  . budesonide-formoterol  2 puff Inhalation BID  . enoxaparin (LOVENOX) injection  40 mg Subcutaneous  Q24H  . fish oil-omega-3 fatty acids  2 g Oral Daily  . irbesartan  300 mg Oral Daily   And  . hydrochlorothiazide  25 mg Oral Daily  . methylPREDNISolone (SOLU-MEDROL) injection  40 mg Intravenous Q24H  . multivitamin with minerals  1 tablet Oral Daily  . pantoprazole  80 mg Oral Daily  . simvastatin  20 mg Oral q1800  . travoprost (benzalkonium)  1 drop Both Eyes QHS   Continuous Infusions: . sodium chloride 100 mL/hr at 11/28/13 1444  . sodium chloride 75 mL/hr at 11/27/13 1748    Principal Problem:   UIP (usual interstitial pneumonitis) Active Problems:   Type II or unspecified type diabetes mellitus without mention of complication, not stated as uncontrolled   Essential hypertension, benign   Pure hypercholesterolemia   GERD (gastroesophageal reflux disease)   Pulmonary interstitial fibrosis   Left-sided chest wall pain   Unspecified chronic bronchitis   Chest pain on breathing    Time spent: 35 minute    WOODS, [place], J  [facility] Pager [phone]. If 7PM-7AM, please contact  night-coverage at [email or web address], password [facility] 11/28/2013, 8:47 PM  LOS: 1 day

## 2013-11-28 NOTE — Progress Notes (Signed)
Patient is feeling nauseated on her stomach after the pain pill. RN will administer IV zofran. Will continue to monitor

## 2013-11-28 NOTE — Care Management Note (Unsigned)
    Page 1 of 1   12/03/2013     11:40:51 AM   CARE MANAGEMENT NOTE 12/03/2013  Patient:  Margaret Adkins, Margaret Adkins   Account Number:  [id number]  Date Initiated:  11/28/2013  Documentation initiated by:  Tomi Bamberger  Subjective/Objective Assessment:   dx usual interstitial pneumonitis  admit- lives alone.  indep living- [facility]. Has home oxygen with [employer].     Action/Plan:   pt eval- no pt follow up needed.   Anticipated DC Date:  12/03/2013   Anticipated DC Plan:  [place]  CM consult      Choice offered to / List presented to:             Status of service:  In process, will continue to follow Medicare Important Message given?   (If response is "NO", the following Medicare IM given date fields will be blank) Date Medicare IM given:   Date Additional Medicare IM given:    Discharge Disposition:    Per UR Regulation:  Reviewed for med. necessity/level of care/duration of stay  If discussed at [place] of Stay Meetings, dates discussed:   12/03/2013    Comments:  12/03/13 [place] ,BSN [phone] patient for possible dc today.  Per physical therapy no pt f/u needed.  12/02/13 [place], BSN [phone] patient lives alone in indep living [facility], patient has home oxygen with [employer] and she has a oxygen tank in her room to go home with.  Patient states she would like to have pcs services for someone to come and clean up her hous, like her neighbor has, NCM informed her this is through [place] and she does not have Medicaid.  Patient states her income is too much for her to get medicaid.  NCM informed her how [facility] services work, and if she needs this service I will set it up for her.  Patient states one of her church members will be transporting her home at dc.

## 2013-11-28 NOTE — Progress Notes (Signed)
Utilization review completed.  

## 2013-11-28 NOTE — Consult Note (Signed)
Name: ERNEST ORR MRN: [id number] DOB: 1941-01-26    ADMISSION DATE:  11/27/2013 CONSULTATION DATE:  11/28/13  REFERRING MD :  Clydene Laming PRIMARY SERVICE:  [facility]   CHIEF COMPLAINT:  IPF  BRIEF PATIENT DESCRIPTION: 73 yo female with extensive PMH including IPF (presumed UIP, followed by CY), bronchiectasis on home O2,  Hepatitis C, HTN presented 3/25 with worsening SOB and increased thick sputum.  Last OV (11/06/13) home O2 was increased and plan was for updated CT chest and consider start pirfenidone. Admitted by [facility] and PCCM consulted 3/26.   SIGNIFICANT EVENTS / STUDIES:  CT chest 3/17 (outpt) >>> Progressive worsening interstitial lung disease with imaging<BR>characteristics most compatible with usual interstitial pneumonia (UIP)   LINES / TUBES: none  CULTURES: RVP 3/26>>> Flu PCR 3/25>>> neg  Sputum 3/26>>>  ANTIBIOTICS: none  HISTORY OF PRESENT ILLNESS:  73 yo female with extensive PMH including IPF (presumed UIP, followed by CY), bronchiectasis on home O2,  Hepatitis C, HTN presented 3/25 with worsening SOB and increased thick sputum.  Last OV (11/06/13) plan was for updated CT chest and consider start pirfenidone.   Currently c/o significant SOB, much worse with exertion. Has intermittent cough productive of thick white sputum.  C/o L knee, hip, foot pain r/t arthritis.  Denies fevers, chills, purulent sputum, chest pain, hemotpysis, orthopnea, night sweats, leg/calf pain.    PAST MEDICAL HISTORY :  Past Medical History  Diagnosis Date  . Colon polyps   . Hepatitis C   . Diverticulosis   . Diabetes mellitus   . Hypertension   . GERD (gastroesophageal reflux disease)   . Osteoporosis   . Hyperlipidemia    Past Surgical History  Procedure Laterality Date  . Cholecystectomy  2004  . Rotator cuff repair      right  . Abdominal hysterectomy      and LSO in 1980's  . Lipoma removal      abdomen  . Breast biopsy      left, benign  . Hemicolectomy      left, for  diverticulitis (in [place])   Prior to Admission medications   Medication Sig Start Date End Date Taking? Authorizing Provider  albuterol (PROVENTIL HFA;VENTOLIN HFA) 108 (90 BASE) MCG/ACT inhaler Inhale 2 puffs into the lungs every 6 (six) hours as needed for wheezing or shortness of breath. 06/13/13 02/05/15 Yes Deneise Lever, MD  aspirin 81 MG EC tablet Take 81 mg by mouth daily.     Yes Historical Provider, MD  budesonide-formoterol [facility]) 160-4.5 MCG/ACT inhaler 2 puffs then rinse mouth, twice daily 06/13/13  Yes Deneise Lever, MD  diphenhydrAMINE (BENADRYL) 25 mg capsule Take 25 mg by mouth every 6 (six) hours as needed for allergies.    Yes Historical Provider, MD  fish oil-omega-3 fatty acids 1000 MG capsule Take 2 g by mouth daily.   Yes Historical Provider, MD  Multiple Vitamins-Minerals (ALIVE ONCE DAILY WOMENS 50+) TABS Take by mouth.   Yes Historical Provider, MD  Multiple Vitamins-Minerals ([place] PO) Take 1 capsule by mouth daily.   Yes Historical Provider, MD  olmesartan-hydrochlorothiazide (BENICAR HCT) 40-25 MG per tablet Take 1 tablet by mouth daily. 07/04/13  Yes Rita Ohara, MD  omeprazole (PRILOSEC) 40 MG capsule Take 1 capsule (40 mg total) by mouth daily. 05/13/13  Yes Camelia Eng Tysinger, PA-C  pioglitazone-metformin (ACTOPLUS MET) 15-500 MG per tablet Take 1 tablet by mouth 2 (two) times daily with a meal. 05/13/13  Yes Camelia Eng  Tysinger, PA-C  pravastatin (PRAVACHOL) 40 MG tablet Take 1 tablet (40 mg total) by mouth daily. 05/13/13  Yes Camelia Eng Tysinger, PA-C  traMADol (ULTRAM) 50 MG tablet Take 50 mg by mouth every 6 (six) hours as needed for moderate pain. 1 or 2 every 6 hours if needed for pain 11/06/13  Yes Deneise Lever, MD  travoprost, benzalkonium, (TRAVATAN) 0.004 % ophthalmic solution Place 1 drop into both eyes at bedtime.     Yes Historical Provider, MD  nitroGLYCERIN (NITROSTAT) 0.4 MG SL tablet Place 1 tablet (0.4 mg total) under the tongue every 5 (five)  minutes as needed for chest pain. 02/09/12 11/06/13  Camelia Eng Tysinger, PA-C   Allergies  Allergen Reactions  . Codeine Rash    FAMILY HISTORY:  Family History  Problem Relation Age of Onset  . Liver cancer Mother    SOCIAL HISTORY:  reports that she quit smoking about 55 years ago. Her smoking use included Cigarettes. She has a 10 pack-year smoking history. She has never used smokeless tobacco. She reports that she does not drink alcohol or use illicit drugs.  REVIEW OF SYSTEMS:   As per HPI - All other systems reviewed and were neg.    VITAL SIGNS: Temp:  [97.3 F (36.3 C)-98 F (36.7 C)] 98 F (36.7 C) (03/26 0505) Pulse Rate:  [66-101] 76 (03/26 0505) Resp:  [13-40] 18 (03/26 0505) BP: (110-152)/(51-81) 126/73 mmHg (03/26 0505) SpO2:  [89 %-100 %] 99 % (03/26 0747) FiO2 (%):  [30 %-40 %] 30 % (03/25 1345) Weight:  [150 lb (68.04 kg)-152 lb (68.947 kg)] 151 lb 10.8 oz (68.8 kg) (03/25 2053)   PHYSICAL EXAMINATION: General:  Frail, chronically ill appearing female, NAD sitting OOB in chair  Neuro:  Awake, alert, appropriate, MAE HEENT:  Mm moist, no JVD Cardiovascular:  s1s2 rrr Lungs:  resps even, tachypneic, crackles throughout R>L Abdomen:  Soft, +bs Musculoskeletal:  Warm and dry, no BLE edema, L knee and L foot tenderness r/t arthritis    Recent Labs Lab 11/27/13 1231 11/27/13 1730 11/28/13 0700  NA 141  --  140  K 4.3  --  4.4  CL 95*  --  101  CO2 24  --  27  BUN 13  --  16  CREATININE 0.83 0.83 0.76  GLUCOSE 87  --  146*    Recent Labs Lab 11/27/13 1231 11/27/13 1730 11/28/13 0700  HGB 13.3 11.5* 10.9*  HCT 40.7 36.0 33.7*  WBC 7.9 6.3 5.6  PLT 185 179 188   Dg Chest 2 View  11/27/2013   CLINICAL DATA:  Short of breath. Weakness. Hypertension and diabetic.  EXAM: CHEST  2 VIEW  COMPARISON:  CT CHEST HIGH RESOLUTION W/O CM dated 11/19/2013; DG CHEST 2 VIEW dated 11/06/2013  FINDINGS: Diminished lung volumes on the frontal radiograph, secondary to AP  portable technique. Cardiomegaly accentuated by AP portable technique. No definite pleural fluid. Given differences in lung volumes, similar appearance of interstitial lung disease. No acute superimposed airspace consolidation. No congestive failure. Cholecystectomy clips.  IMPRESSION: Low lung volume portable frontal radiograph. 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 No evidence of acute superimposed process.   Electronically Signed   By: Abigail Miyamoto M.D.   On: 11/27/2013 12:45    ASSESSMENT / PLAN:  Acute on chronic hypoxic respiratory failure r/t pulmonary fibrosis - presumed UIP with flare. Overall disease progression.   See CT results above.  Dr. Annamaria Boots was  considering addition pirfenidone as outpt. Does not appear to have active infection, afebrile, no leukocytosis, no purulent sputum.  Normal lactate.  DNR.   REC -  -Continue steroids  -Will d/w Dr Annamaria Boots re: ?pirfenidone - although would likely not help acutely -Continuous O2  -Pulmonary hygiene  -continue home symbicort  -change scheduled xopenex to PRN - does not help per pt, seems to make coughing worse -F/u CXR  -No further ABG -Continue to hold off on abx - would need HCAP coverage if spikes fever or leukocytosis  -Case management consult -- pt wants different/ portable O2 for home (uses [employer]) and needs home health aide  -Continue discussions regarding Assisted Living in the near future- she is open to this -Consider PRN morphine for dyspnea    WHITEHEART,KATHRYN, NP 11/28/2013  9:19 AM Pager: [phone] or [phone]  *Care during the described time interval was provided by me and/or other providers on the critical care team. I have reviewed this patient's available data, including medical history, events of note, physical examination and test results as part of my evaluation.   No role for pirfenidone at this point, steroids should be enough.  No further  recommendations to treat IPF at this point.  Will make apt with Dr. Chase Caller as outpatient.  PCCM will sign off, please call back if needed.  Patient seen and examined, agree with above note.  I dictated the care and orders written for this patient under my direction.  Rush Farmer, MD [phone]

## 2013-11-29 DIAGNOSIS — J9620 Acute and chronic respiratory failure, unspecified whether with hypoxia or hypercapnia: Secondary | ICD-10-CM

## 2013-11-29 LAB — COMPREHENSIVE METABOLIC PANEL
ALK PHOS: 53 U/L (ref 39–117)
ALT: 18 U/L (ref 0–35)
AST: 25 U/L (ref 0–37)
Albumin: 3.2 g/dL — ABNORMAL LOW (ref 3.5–5.2)
BUN: 11 mg/dL (ref 6–23)
CHLORIDE: 103 meq/L (ref 96–112)
CO2: 28 meq/L (ref 19–32)
Calcium: 8.8 mg/dL (ref 8.4–10.5)
Creatinine, Ser: 0.72 mg/dL (ref 0.50–1.10)
GFR calc non Af Amer: 84 mL/min — ABNORMAL LOW (ref >90)
GLUCOSE: 144 mg/dL — AB (ref 70–99)
POTASSIUM: 4.4 meq/L (ref 3.7–5.3)
Sodium: 142 mEq/L (ref 137–147)
TOTAL PROTEIN: 6.5 g/dL (ref 6.0–8.3)
Total Bilirubin: 0.3 mg/dL (ref 0.3–1.2)

## 2013-11-29 LAB — CBC WITH DIFFERENTIAL/PLATELET
Basophils Absolute: 0 10*3/uL (ref 0.0–0.1)
Basophils Relative: 0 % (ref 0–1)
EOS ABS: 0 10*3/uL (ref 0.0–0.7)
Eosinophils Relative: 0 % (ref 0–5)
HCT: 32 % — ABNORMAL LOW (ref 36.0–46.0)
HEMOGLOBIN: 10.2 g/dL — AB (ref 12.0–15.0)
Lymphocytes Relative: 15 % (ref 12–46)
Lymphs Abs: 1.1 10*3/uL (ref 0.7–4.0)
MCH: 28.7 pg (ref 26.0–34.0)
MCHC: 31.9 g/dL (ref 30.0–36.0)
MCV: 90.1 fL (ref 78.0–100.0)
MONOS PCT: 6 % (ref 3–12)
Monocytes Absolute: 0.5 10*3/uL (ref 0.1–1.0)
NEUTROS PCT: 79 % — AB (ref 43–77)
Neutro Abs: 5.9 10*3/uL (ref 1.7–7.7)
Platelets: 172 10*3/uL (ref 150–400)
RBC: 3.55 MIL/uL — ABNORMAL LOW (ref 3.87–5.11)
RDW: 13.6 % (ref 11.5–15.5)
WBC: 7.4 10*3/uL (ref 4.0–10.5)

## 2013-11-29 LAB — GLUCOSE, CAPILLARY
Glucose-Capillary: 115 mg/dL — ABNORMAL HIGH (ref 70–99)
Glucose-Capillary: 102 mg/dL — ABNORMAL HIGH (ref 70–99)
Glucose-Capillary: 95 mg/dL (ref 70–99)
Glucose-Capillary: 161 mg/dL — ABNORMAL HIGH (ref 70–99)

## 2013-11-29 LAB — MAGNESIUM: Magnesium: 1.3 mg/dL — ABNORMAL LOW (ref 1.5–2.5)

## 2013-11-29 MED ORDER — INSULIN ASPART 100 UNIT/ML ~~LOC~~ SOLN
0.0000 [IU] | Freq: Three times a day (TID) | SUBCUTANEOUS | Status: DC
  Administered 2013-12-01 – 2013-12-03 (×6): 1 [IU] via SUBCUTANEOUS

## 2013-11-29 NOTE — Progress Notes (Addendum)
PULMONARY / CRITICAL CARE MEDICINE  Name: Margaret Adkins MRN: [id number] DOB: [birth year]    ADMISSION DATE:  11/27/2013 CONSULTATION DATE:  11/28/13  REFERRING MD :  Clydene Laming PRIMARY SERVICE:  [facility]  CHIEF COMPLAINT:  IPF  BRIEF PATIENT DESCRIPTION: 73 yo female with IPF ( presumed UIP, followed by CY) and bronchiectasis, on home oxygen presented 3/25 with worsening dyspnea and increased thick sputum.  Last office visit 3/4 oxygen was increased and plan was to repeat chest CT and consider start Pirfenidone. Admitted by [facility] and PCCM consulted 3/26.   SIGNIFICANT EVENTS / STUDIES:  3/17  CT chest  (outpt) >>> Progressive worsening interstitial lung disease with imaging characteristics most compatible with usual interstitial pneumonia (UIP)   LINES / TUBES:  CULTURES: 3/26  RVP  >>> 3/25  Flu PCR >>> neg  3/26  Sputum >>>  ANTIBIOTICS:  INTERVAL HISTORY:  Remains dyspneic  VITAL SIGNS: Temp:  [97.3 F (36.3 C)-97.8 F (36.6 C)] 97.3 F (36.3 C) (03/27 0610) Pulse Rate:  [64-66] 66 (03/27 0610) Resp:  [18] 18 (03/27 0610) BP: (138-152)/(70-76) 152/76 mmHg (03/27 0610) SpO2:  [98 %-100 %] 100 % (03/27 0809)   PHYSICAL EXAMINATION: General:  No distress Neuro:  Awake, alert, cooperative HEENT:  No JVD Cardiovascular:  Regular, no murmurs Lungs:  Bilateral diminished air entry, diffuse rales Abdomen:  Soft, non tender Musculoskeletal:  No edema  LABS: CBC  Recent Labs Lab 11/27/13 1730 11/28/13 0700 11/29/13 0530  WBC 6.3 5.6 7.4  HGB 11.5* 10.9* 10.2*  HCT 36.0 33.7* 32.0*  PLT 179 188 172   Coag's No results found for this basename: APTT, INR,  in the last 168 hours BMET  Recent Labs Lab 11/27/13 1231 11/27/13 1730 11/28/13 0700 11/29/13 0530  NA 141  --  140 142  K 4.3  --  4.4 4.4  CL 95*  --  101 103  CO2 24  --  27 28  BUN 13  --  16 11  CREATININE 0.83 0.83 0.76 0.72  GLUCOSE 87  --  146* 144*   Electrolytes  Recent Labs Lab 11/27/13 1231  11/28/13 0700 11/29/13 0530  CALCIUM 10.1 9.1 8.8  MG  --  1.2* 1.3*   Sepsis Markers  Recent Labs Lab 11/27/13 1324  LATICACIDVEN 0.90   ABG  Recent Labs Lab 11/27/13 1250 11/27/13 1302  PHART 7.407 7.420  PCO2ART 46.7* 45.7*  PO2ART 27.0* 275.0*   Liver Enzymes  Recent Labs Lab 11/27/13 1231 11/28/13 0700 11/29/13 0530  AST 33 24 25  ALT 23 18 18   ALKPHOS 72 58 53  BILITOT 0.5 0.5 0.3  ALBUMIN 4.3 3.4* 3.2*   Cardiac Enzymes  Recent Labs Lab 11/27/13 1231 11/27/13 1730 11/27/13 2350  TROPONINI  --  <0.30 <0.30  PROBNP 287.7* 226.3*  --    Glucose  Recent Labs Lab 11/28/13 0750 11/28/13 1151 11/28/13 1656 11/28/13 2134 11/29/13 0758 11/29/13 1238  GLUCAP 138* 125* 143* 157* 115* 102*   IMAGING: No results found.  ASSESSMENT / PLAN:  Acute on chronic hypoxic respiratory failure IPF / UIP No evidence of pulmonary infection DNR   Supplemental oxygen for SpO2>92   Solu-Medrol  Symbicort / Xopenex PRN  Would defer Pirfenidone decision to Dr. Annamaria Boots in outpatient setting  No indication for abx  Would use opioids for dyspnea  Case management is to assist with obtaining portable liquid oxygen unit and home health aide   Continue discussions regarding assisted living  Please  schedule with Dr. Chase Caller [facility] ) up on discharge  I have personally obtained history, examined patient, evaluated and interpreted laboratory and imaging results, reviewed medical records, formulated assessment / plan and placed orders.  Doree Fudge, MD Pulmonary and [place] Pager: [phone]  11/29/2013, 2:59 PM

## 2013-11-29 NOTE — Progress Notes (Signed)
Patient ID: Margaret Adkins, female   DOB: February 05, 1941, 73 y.o.   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 Last office visit 3/4 oxygen was increased and plan was to repeat chest CT and consider start Pirfenidone. Admitted by [facility] and PCCM consulted 3/26.   Principal Problem:   Acute hypoxic respiratory failure, IPF - still rales on exam but maintaining oxygen saturation at target range with oxygen via [place] - continue supplemental oxygen for SpO2>92, solumedrol  - Symbicort / Xopenex PRN  - appreciate PCCM input  Active Problems:   Type II or unspecified type diabetes mellitus without mention of complication, not stated as uncontrolled - reasonable inpatient control  - place on SSI as pt requiring solumedrol    Essential hypertension, benign - reasonable inpatient control    GERD (gastroesophageal reflux disease) - continue PPI  Consultants:  PCCM Procedures/Studies:  3/17 CT chest (outpt) >>> Progressive worsening interstitial lung disease with imaging characteristics most compatible with usual interstitial pneumonia (UIP)  Antibiotics:  None  Code Status: DNR Family Communication: Pt at bedside Disposition Plan: Home when medically stable  HPI/Subjective: No events overnight.   Objective: Filed Vitals:   11/28/13 2140 11/28/13 2227 11/29/13 0610 11/29/13 0809  BP: 138/70  152/76   Pulse: 64  66   Temp: 97.8 F (36.6 C)  97.3 F (36.3 C)   TempSrc: Oral  Oral   Resp: 18  18   Height:      Weight:      SpO2: 100% 98% 100% 100%    Intake/Output Summary (Last 24 hours) at 11/29/13 1640 Last data filed at 11/29/13 1403  Gross per 24 hour  Intake   2387 ml  Output      0 ml  Net   2387 ml     Exam:   General:  Pt is alert, follows commands appropriately, not in acute distress  Cardiovascular: Regular rate and rhythm, S1/S2, no murmurs, no rubs, no gallops  Respiratory: Bibasilar rales and diffusely diminished air movement   Abdomen: Soft, non tender, non distended, bowel sounds present, no guarding  Extremities: No edema, pulses DP and PT palpable bilaterally  Neuro: Grossly nonfocal  Data Reviewed: Basic Metabolic Panel:  Recent Labs Lab 11/27/13 1231 11/27/13 1730 11/28/13 0700 11/29/13 0530  NA 141  --  140 142  K 4.3  --  4.4 4.4  CL 95*  --  101 103  CO2 24  --  27 28  GLUCOSE 87  --  146* 144*  BUN 13  --  16 11  CREATININE 0.83 0.83 0.76 0.72  CALCIUM 10.1  --  9.1 8.8  MG  --   --  1.2* 1.3*   Liver Function Tests:  Recent Labs Lab 11/27/13 1231 11/28/13 0700 11/29/13 0530  AST 33 24 25  ALT 23 18 18   ALKPHOS 72 58 53  BILITOT 0.5 0.5 0.3  PROT 8.6* 7.0 6.5  ALBUMIN 4.3 3.4* 3.2*    Recent Labs Lab 11/27/13 1231  LIPASE 57   CBC:  Recent Labs Lab 11/27/13 1231 11/27/13 1730 11/28/13 0700 11/29/13 0530  WBC 7.9 6.3 5.6 7.4  NEUTROABS 4.8  --  4.6 5.9  HGB 13.3 11.5* 10.9* 10.2*  HCT 40.7 36.0 33.7*  32.0*  MCV 90.0 90.9 90.1 90.1  PLT 185 179 188 172   Cardiac Enzymes:  Recent Labs Lab 11/27/13 1730 11/27/13 2350  TROPONINI <0.30 <0.30   CBG:  Recent Labs Lab 11/28/13 1151 11/28/13 1656 11/28/13 2134 11/29/13 0758 11/29/13 1238  GLUCAP 125* 143* 157* 115* 102*   Scheduled Meds: . aspirin EC  81 mg Oral Daily  . budesonide-formoterol  2 puff Inhalation BID  . enoxaparin injection  40 mg Subcutaneous Q24H  . irbesartan  300 mg Oral Daily   And  . hydrochlorothiazide  25 mg Oral Daily  . methylPREDNISolone injection  40 mg Intravenous Q24H  . multivitamin   1 tablet Oral Daily  . pantoprazole  80 mg Oral Daily  . simvastatin  20 mg Oral q1800   Continuous Infusions: . sodium chloride 100 mL/hr  (11/29/13 1019)  . sodium chloride 75 mL/hr at 11/27/13 1748   Faye Ramsay, MD  [facility] Pager [phone]  If 7PM-7AM, please contact night-coverage [email or web address] Password [email or web address] 11/29/2013, 4:40 PM   LOS: 2 days

## 2013-11-29 NOTE — Consult Note (Deleted)
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  Last office visit 3/4 oxygen was increased and plan was to repeat chest CT and consider start Pirfenidone. Admitted by [facility] and PCCM consulted 3/26.   SIGNIFICANT EVENTS / STUDIES:  3/17  CT chest  (outpt) >>> Progressive worsening interstitial lung disease with imaging characteristics most compatible with usual interstitial pneumonia (UIP)   LINES / TUBES:  CULTURES: 3/26  RVP  >>> 3/25  Flu PCR >>> neg  3/26  Sputum >>>  ANTIBIOTICS:  INTERVAL HISTORY:  Remains dyspneic  VITAL SIGNS: Temp:  [97.3 F (36.3 C)-97.8 F (36.6 C)] 97.3 F (36.3 C) (03/27 0610) Pulse Rate:  [64-66] 66 (03/27 0610) Resp:  [18] 18 (03/27 0610) BP: (138-152)/(70-76) 152/76 mmHg (03/27 0610) SpO2:  [98 %-100 %] 100 % (03/27 0809)   PHYSICAL EXAMINATION: General:  No distress Neuro:  Awake, alert, cooperative HEENT:  No JVD Cardiovascular:  Regular, no murmurs Lungs:  Bilateral diminished air entry, diffuse rales Abdomen:  Soft, non tender Musculoskeletal:  No edema  LABS: CBC  Recent Labs Lab 11/27/13 1730 11/28/13 0700 11/29/13 0530  WBC 6.3 5.6 7.4  HGB 11.5* 10.9* 10.2*  HCT 36.0 33.7* 32.0*  PLT 179 188 172   Coag's No results found for this basename: APTT, INR,  in the last 168 hours BMET  Recent Labs Lab 11/27/13 1231 11/27/13 1730 11/28/13 0700 11/29/13 0530  NA 141  --  140 142  K 4.3  --  4.4 4.4  CL 95*  --  101 103  CO2 24  --  27 28  BUN 13  --  16 11  CREATININE 0.83 0.83 0.76 0.72  GLUCOSE 87  --  146* 144*   Electrolytes  Recent Labs Lab 11/27/13 1231  11/28/13 0700 11/29/13 0530  CALCIUM 10.1 9.1 8.8  MG  --  1.2* 1.3*   Sepsis Markers  Recent Labs Lab 11/27/13 1324  LATICACIDVEN 0.90   ABG  Recent Labs Lab 11/27/13 1250 11/27/13 1302  PHART 7.407 7.420  PCO2ART 46.7* 45.7*  PO2ART 27.0* 275.0*   Liver Enzymes  Recent Labs Lab 11/27/13 1231 11/28/13 0700 11/29/13 0530  AST 33 24 25  ALT 23 18 18   ALKPHOS 72 58 53  BILITOT 0.5 0.5 0.3  ALBUMIN 4.3 3.4* 3.2*   Cardiac Enzymes  Recent Labs Lab 11/27/13 1231 11/27/13 1730 11/27/13 2350  TROPONINI  --  <0.30 <0.30  PROBNP 287.7* 226.3*  --    Glucose  Recent Labs Lab 11/28/13 0750 11/28/13 1151 11/28/13 1656 11/28/13 2134 11/29/13 0758 11/29/13 1238  GLUCAP 138* 125* 143* 157* 115* 102*   IMAGING: No results found.  ASSESSMENT / PLAN:  Acute on chronic hypoxic respiratory failure IPF / UIP No evidence of pulmonary infection DNR   Supplemental oxygen for SpO2>92   Solu-Medrol  Symbicort / Xopenex PRN  Would defer Pirfenidone decision to Dr. Annamaria Boots in outpatient setting  No indication for abx  Would use opioids for dyspnea  Case management is to assist with changing DME company and obtaining home health aide   Continue discussions regarding assisted living  Please schedule  with Dr. Chase Caller [facility] ) up on discharge  I have personally obtained history, examined patient, evaluated and interpreted laboratory and imaging results, reviewed medical records, formulated assessment / plan and placed orders.  Doree Fudge, MD Pulmonary and [place] Pager: [phone]  11/29/2013, 2:58 PM

## 2013-11-29 NOTE — Progress Notes (Signed)
Patient states that her left lower leg is hurting. RN assessed patient's leg and the pain was in her left foot. Patient asked to have something for pain. RN will be giving PRN tylenol with patient's night meds. Will continue to monitor

## 2013-11-30 LAB — CBC WITH DIFFERENTIAL/PLATELET
BASOS ABS: 0 10*3/uL (ref 0.0–0.1)
Basophils Relative: 0 % (ref 0–1)
Eosinophils Absolute: 0 10*3/uL (ref 0.0–0.7)
Eosinophils Relative: 0 % (ref 0–5)
HCT: 30.4 % — ABNORMAL LOW (ref 36.0–46.0)
Hemoglobin: 9.9 g/dL — ABNORMAL LOW (ref 12.0–15.0)
Lymphocytes Relative: 26 % (ref 12–46)
Lymphs Abs: 2.1 10*3/uL (ref 0.7–4.0)
MCH: 29.5 pg (ref 26.0–34.0)
MCHC: 32.6 g/dL (ref 30.0–36.0)
MCV: 90.5 fL (ref 78.0–100.0)
MONO ABS: 0.5 10*3/uL (ref 0.1–1.0)
Monocytes Relative: 6 % (ref 3–12)
NEUTROS ABS: 5.4 10*3/uL (ref 1.7–7.7)
Neutrophils Relative %: 68 % (ref 43–77)
Platelets: 166 10*3/uL (ref 150–400)
RBC: 3.36 MIL/uL — ABNORMAL LOW (ref 3.87–5.11)
RDW: 13.7 % (ref 11.5–15.5)
WBC: 8 10*3/uL (ref 4.0–10.5)

## 2013-11-30 LAB — COMPREHENSIVE METABOLIC PANEL
ALK PHOS: 50 U/L (ref 39–117)
ALT: 18 U/L (ref 0–35)
AST: 26 U/L (ref 0–37)
Albumin: 3.2 g/dL — ABNORMAL LOW (ref 3.5–5.2)
BUN: 16 mg/dL (ref 6–23)
CHLORIDE: 102 meq/L (ref 96–112)
CO2: 28 meq/L (ref 19–32)
Calcium: 8.9 mg/dL (ref 8.4–10.5)
Creatinine, Ser: 0.82 mg/dL (ref 0.50–1.10)
GFR calc non Af Amer: 70 mL/min — ABNORMAL LOW (ref >90)
GFR, EST AFRICAN AMERICAN: 81 mL/min — AB (ref >90)
Glucose, Bld: 111 mg/dL — ABNORMAL HIGH (ref 70–99)
POTASSIUM: 4.3 meq/L (ref 3.7–5.3)
Sodium: 144 mEq/L (ref 137–147)
Total Bilirubin: 0.3 mg/dL (ref 0.3–1.2)
Total Protein: 6.4 g/dL (ref 6.0–8.3)

## 2013-11-30 LAB — GLUCOSE, CAPILLARY
GLUCOSE-CAPILLARY: 94 mg/dL (ref 70–99)
GLUCOSE-CAPILLARY: 85 mg/dL (ref 70–99)
GLUCOSE-CAPILLARY: 105 mg/dL — AB (ref 70–99)
Glucose-Capillary: 141 mg/dL — ABNORMAL HIGH (ref 70–99)

## 2013-11-30 LAB — MAGNESIUM: MAGNESIUM: 1.5 mg/dL (ref 1.5–2.5)

## 2013-11-30 MED ORDER — METHYLPREDNISOLONE SODIUM SUCC 125 MG IJ SOLR
80.0000 mg | Freq: Two times a day (BID) | INTRAMUSCULAR | Status: DC
  Administered 2013-11-30 – 2013-12-01 (×2): 80 mg via INTRAVENOUS
  Filled 2013-11-30 (×4): qty 1.28

## 2013-11-30 MED ORDER — HYDROCHLOROTHIAZIDE 25 MG PO TABS
25.0000 mg | ORAL_TABLET | Freq: Every day | ORAL | Status: DC
  Administered 2013-12-01 – 2013-12-03 (×3): 25 mg via ORAL
  Filled 2013-11-30 (×4): qty 1

## 2013-11-30 MED ORDER — METHYLPREDNISOLONE SODIUM SUCC 40 MG IJ SOLR: 40.0000 mg | Freq: Two times a day (BID) | INTRAMUSCULAR | Status: DC

## 2013-11-30 MED ORDER — IRBESARTAN 300 MG PO TABS
300.0000 mg | ORAL_TABLET | Freq: Every day | ORAL | Status: DC
  Administered 2013-11-30 – 2013-12-03 (×4): 300 mg via ORAL
  Filled 2013-11-30 (×4): qty 1

## 2013-11-30 NOTE — Evaluation (Signed)
Physical Therapy Evaluation Patient Details Name: Margaret Adkins MRN: [id number] DOB: Aug 28, 1941 Today's Date: 11/30/2013   History of Present Illness  Admitted with approx. 1 week h/o progressive SOB.  Clinical Impression  Pt admitted with/for SOB.  Pt currently limited functionally due to the problems listed below.  (see problems list.)  Pt will benefit from PT to maximize function and safety to be able to get home safely with limited or no assist      Follow Up Recommendations No PT follow up    Equipment Recommendations  None recommended by PT    Recommendations for Other Services       Precautions / Restrictions Precautions Precaution Comments: uses 2 L O2 [place] regularly Restrictions Weight Bearing Restrictions: No      Mobility  Bed Mobility                  Transfers Overall transfer level: Modified independent                  Ambulation/Gait Ambulation/Gait assistance: Supervision Ambulation Distance (Feet): 160 Feet (with multiple standing rests to "catch breath") Assistive device: None (pulled portable O2 behind) Gait Pattern/deviations: Step-through pattern Gait velocity: slow Gait velocity interpretation: Below normal speed for age/gender General Gait Details: steady gait, but slow due to decr activity tolerance.  Stairs            Wheelchair Mobility    Modified Rankin (Stroke Patients Only)       Balance Overall balance assessment: No apparent balance deficits (not formally assessed)                                   Pertinent Vitals/Pain 93% sats on 2L [place] during gait first check   HR 80's,  After toileting, sats on 2L dropped to 83%   EHR in 90's.  Needed 3L [place] to bring into 90's%    Home Living Family/patient expects to be discharged to:: Private residence Living Arrangements: Alone   Type of Home: Apartment (seinior living) Home Access: Level entry     Home Layout: One level Home Equipment: Environmental consultant  - 2 wheels Additional Comments: does not use assistive device    Prior Function                 Hand Dominance        Extremity/Trunk Assessment   Upper Extremity Assessment: Overall WFL for tasks assessed           Lower Extremity Assessment: Overall WFL for tasks assessed;Generalized weakness         Communication   Communication: No difficulties  Cognition Arousal/Alertness: Awake/alert Behavior During Therapy: WFL for tasks assessed/performed Overall Cognitive Status: Within Functional Limits for tasks assessed                      General Comments      Exercises        Assessment/Plan    PT Assessment Patient needs continued PT services  PT Diagnosis  (decr activity tolerance)   PT Problem List Decreased activity tolerance;Decreased strength;Decreased mobility;Cardiopulmonary status limiting activity  PT Treatment Interventions Functional mobility training;Therapeutic activities;Gait training;Patient/family education   PT Goals (Current goals can be found in the Care Plan section) Acute Rehab PT Goals Patient Stated Goal: Be able to walk around without losing my breath.  Independent PT Goal Formulation: With patient Time For  Goal Achievement: 12/07/13 Potential to Achieve Goals: Good    Frequency Min 3X/week   Barriers to discharge Decreased caregiver support      End of Session Equipment Utilized During Treatment: Oxygen Activity Tolerance: Patient tolerated treatment well Patient left: in chair;with call bell/phone within reach         Time: 1203-1225 PT Time Calculation (min): 22 min   Charges:   PT Evaluation $Initial PT Evaluation Tier I: 1 Procedure PT Treatments $Gait Training: 8-22 mins   PT G Codes:          Xylon Croom, Tessie Fass 11/30/2013, 12:38 PM 11/30/2013  Donnella Sham, PT [phone]  (pager)

## 2013-11-30 NOTE — Progress Notes (Signed)
Patient's b/p this morning is 160/80. NP, Rogue Bussing has been notified. Awaiting any further orders

## 2013-11-30 NOTE — Progress Notes (Signed)
Patient ID: CASH MEADOW, female   DOB: 03/25/1941, 73 y.o.   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 Last office visit 3/4 oxygen was increased and plan was to repeat chest CT and consider start Pirfenidone. Admitted by [facility] and PCCM consulted 3/26.   Principal Problem:  Acute hypoxic respiratory failure, IPF  - still rales on exam but maintaining oxygen saturation at target range with oxygen via [place]  - continue supplemental oxygen for SpO2>92, solumedrol (will increase from QD to BID) - Symbicort / Xopenex PRN  - appreciate PCCM input  Active Problems:  Type II or unspecified type diabetes mellitus without mention of complication, not stated as uncontrolled  - reasonable inpatient control  - place on SSI as pt requiring solumedrol  Essential hypertension, benign  - reasonable inpatient control  GERD (gastroesophageal reflux disease)  - continue PPI   Consultants:  PCCM Procedures/Studies:  3/17 CT chest (outpt) >>> Progressive worsening interstitial lung disease with imaging characteristics most compatible with usual interstitial pneumonia (UIP)  Antibiotics:  None  Code Status: DNR  Family Communication: Pt at bedside  Disposition Plan: Home when medically stable  HPI/Subjective: No events overnight.   Objective: Filed Vitals:   11/30/13 0409 11/30/13 0553 11/30/13 0813 11/30/13 1516  BP: 167/83 160/80  143/82  Pulse: 69   74  Temp: 98.3 F (36.8 C)   97.9 F (36.6 C)  TempSrc: Oral   Oral  Resp: 18   18  Height:      Weight:      SpO2: 100%  94% 100%   No intake or output data in the 24 hours ending 11/30/13 1854  Exam:   General:  Pt is alert, follows commands appropriately, not in acute  distress  Cardiovascular: Regular rate and rhythm, S1/S2, no murmurs, no rubs, no gallops  Respiratory: Course breath sounds bilaterally   Abdomen: Soft, non tender, non distended, bowel sounds present, no guarding  Extremities: No edema, pulses DP and PT palpable bilaterally  Data Reviewed: Basic Metabolic Panel:  Recent Labs Lab 11/27/13 1231 11/27/13 1730 11/28/13 0700 11/29/13 0530 11/30/13 0540  NA 141  --  140 142 144  K 4.3  --  4.4 4.4 4.3  CL 95*  --  101 103 102  CO2 24  --  27 28 28   GLUCOSE 87  --  146* 144* 111*  BUN 13  --  16 11 16   CREATININE 0.83 0.83 0.76 0.72 0.82  CALCIUM 10.1  --  9.1 8.8 8.9  MG  --   --  1.2* 1.3* 1.5   Liver Function Tests:  Recent Labs Lab 11/27/13 1231 11/28/13 0700 11/29/13 0530 11/30/13 0540  AST 33 24 25 26   ALT 23 18 18 18   ALKPHOS 72 58 53 50  BILITOT 0.5 0.5 0.3 0.3  PROT 8.6* 7.0 6.5 6.4  ALBUMIN 4.3 3.4* 3.2* 3.2*    Recent Labs Lab 11/27/13 1231  LIPASE 57   CBC:  Recent Labs Lab 11/27/13 1231 11/27/13 1730 11/28/13 0700 11/29/13 0530 11/30/13 0540  WBC 7.9 6.3 5.6 7.4 8.0  NEUTROABS 4.8  --  4.6 5.9 5.4  HGB 13.3 11.5* 10.9* 10.2* 9.9*  HCT 40.7 36.0 33.7* 32.0* 30.4*  MCV 90.0  90.9 90.1 90.1 90.5  PLT 185 179 188 172 166   Cardiac Enzymes:  Recent Labs Lab 11/27/13 1730 11/27/13 2350  TROPONINI <0.30 <0.30   CBG:  Recent Labs Lab 11/29/13 1750 11/29/13 2122 11/30/13 0752 11/30/13 1158 11/30/13 1652  GLUCAP 95 161* 94 85 105*   Scheduled Meds: . aspirin EC  81 mg Oral Daily  . budesonide-formoterol  2 puff Inhalation BID  . enoxaparin (LOVENOX) injection  40 mg Subcutaneous Q24H  . fish oil-omega-3 fatty acids  2 g Oral Daily  . hydrochlorothiazide  25 mg Oral Daily   And  . irbesartan  300 mg Oral Daily  . insulin aspart  0-9 Units Subcutaneous TID WC  . [START ON 12/01/2013] methylPREDNISolone (SOLU-MEDROL) injection  40 mg Intravenous Q12H  . multivitamin with  minerals  1 tablet Oral Daily  . pantoprazole  80 mg Oral Daily  . simvastatin  20 mg Oral q1800  . travoprost (benzalkonium)  1 drop Both Eyes QHS   Continuous Infusions:    Faye Ramsay, MD  [facility] Pager [phone]  If 7PM-7AM, please contact night-coverage [email or web address] Password [facility] 11/30/2013, 6:54 PM   LOS: 3 days

## 2013-11-30 NOTE — Progress Notes (Signed)
Patient's cbg this evening was 141. RN spoke with patient about possibly contacting the on call MD for some coverage. Patient stated  that she is usually low and had just eaten and taken her solumedrol. She states that it is high because of those two things. She denied need of coverage. Will continue to monitor

## 2013-12-01 LAB — CBC WITH DIFFERENTIAL/PLATELET
Basophils Absolute: 0 10*3/uL (ref 0.0–0.1)
Basophils Relative: 0 % (ref 0–1)
EOS ABS: 0 10*3/uL (ref 0.0–0.7)
EOS PCT: 0 % (ref 0–5)
HEMATOCRIT: 33.3 % — AB (ref 36.0–46.0)
Hemoglobin: 10.8 g/dL — ABNORMAL LOW (ref 12.0–15.0)
Lymphocytes Relative: 15 % (ref 12–46)
Lymphs Abs: 1.1 10*3/uL (ref 0.7–4.0)
MCH: 29.3 pg (ref 26.0–34.0)
MCHC: 32.4 g/dL (ref 30.0–36.0)
MCV: 90.2 fL (ref 78.0–100.0)
Monocytes Absolute: 0.2 10*3/uL (ref 0.1–1.0)
Monocytes Relative: 2 % — ABNORMAL LOW (ref 3–12)
Neutro Abs: 6.1 10*3/uL (ref 1.7–7.7)
Neutrophils Relative %: 83 % — ABNORMAL HIGH (ref 43–77)
Platelets: 172 10*3/uL (ref 150–400)
RBC: 3.69 MIL/uL — AB (ref 3.87–5.11)
RDW: 13.6 % (ref 11.5–15.5)
WBC: 7.3 10*3/uL (ref 4.0–10.5)

## 2013-12-01 LAB — GLUCOSE, CAPILLARY
GLUCOSE-CAPILLARY: 124 mg/dL — AB (ref 70–99)
GLUCOSE-CAPILLARY: 117 mg/dL — AB (ref 70–99)
GLUCOSE-CAPILLARY: 162 mg/dL — AB (ref 70–99)

## 2013-12-01 LAB — COMPREHENSIVE METABOLIC PANEL
ALT: 20 U/L (ref 0–35)
AST: 23 U/L (ref 0–37)
Albumin: 3.3 g/dL — ABNORMAL LOW (ref 3.5–5.2)
Alkaline Phosphatase: 54 U/L (ref 39–117)
BUN: 18 mg/dL (ref 6–23)
CALCIUM: 9.3 mg/dL (ref 8.4–10.5)
CO2: 28 mEq/L (ref 19–32)
Chloride: 100 mEq/L (ref 96–112)
Creatinine, Ser: 0.8 mg/dL (ref 0.50–1.10)
GFR calc Af Amer: 83 mL/min — ABNORMAL LOW (ref >90)
GFR calc non Af Amer: 72 mL/min — ABNORMAL LOW (ref >90)
Glucose, Bld: 144 mg/dL — ABNORMAL HIGH (ref 70–99)
Potassium: 4.1 mEq/L (ref 3.7–5.3)
SODIUM: 141 meq/L (ref 137–147)
Total Bilirubin: 0.4 mg/dL (ref 0.3–1.2)
Total Protein: 7 g/dL (ref 6.0–8.3)

## 2013-12-01 LAB — RESPIRATORY VIRUS PANEL
ADENOVIRUS: NOT DETECTED
INFLUENZA A H1: NOT DETECTED
INFLUENZA A H3: NOT DETECTED
Influenza A: NOT DETECTED
Influenza B: DETECTED — AB
Metapneumovirus: NOT DETECTED
PARAINFLUENZA 3 A: NOT DETECTED
Parainfluenza 1: NOT DETECTED
Parainfluenza 2: NOT DETECTED
RESPIRATORY SYNCYTIAL VIRUS A: NOT DETECTED
RHINOVIRUS: NOT DETECTED
Respiratory Syncytial Virus B: NOT DETECTED

## 2013-12-01 LAB — MAGNESIUM: Magnesium: 1.4 mg/dL — ABNORMAL LOW (ref 1.5–2.5)

## 2013-12-01 MED ORDER — METHYLPREDNISOLONE SODIUM SUCC 40 MG IJ SOLR
40.0000 mg | Freq: Two times a day (BID) | INTRAMUSCULAR | Status: DC
  Administered 2013-12-01 – 2013-12-02 (×3): 40 mg via INTRAVENOUS
  Filled 2013-12-01 (×4): qty 1

## 2013-12-01 MED ORDER — MAGNESIUM OXIDE 400 (241.3 MG) MG PO TABS
400.0000 mg | ORAL_TABLET | Freq: Two times a day (BID) | ORAL | Status: DC
  Administered 2013-12-01 – 2013-12-03 (×5): 400 mg via ORAL
  Filled 2013-12-01 (×7): qty 1

## 2013-12-01 NOTE — Progress Notes (Signed)
Stat cardiac monitor order on patient. Patient not on telemetry. Clarified with MD. Telephone order that patient does not need telemetry.

## 2013-12-01 NOTE — Progress Notes (Signed)
Patient ID: Margaret Adkins, female   DOB: 1940-11-18, 73 y.o.   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 Last office visit 3/4 oxygen was increased and plan was to repeat chest CT and consider start Pirfenidone. Admitted by [facility] and PCCM consulted 3/26.   Principal Problem:  Acute hypoxic respiratory failure, IPF  - lungs much clearer on examination this AM but still poor inspiratory effort and diminished air movement at bases, no wheezing  - continue supplemental oxygen for SpO2>92, solumedrol dose was increased and since pt doing better, we can plan on tapering down  - Symbicort / Xopenex PRN  - appreciate PCCM input  Active Problems:  Type II or unspecified type diabetes mellitus without mention of complication, not stated as uncontrolled  - reasonable inpatient control  - on SSI as pt requiring solumedrol  Essential hypertension, benign  - reasonable inpatient control  GERD (gastroesophageal reflux disease)  - continue PPI  Hypomagnesemia - will supplement and repeat Mg level in AM  Consultants:  PCCM Procedures/Studies:  3/17 CT chest (outpt) >>> Progressive worsening interstitial lung disease with imaging characteristics most compatible with usual interstitial pneumonia (UIP)  Antibiotics:  None  Code Status: DNR  Family Communication: Daughter over the phone  Disposition Plan: Home when medically stable  HPI/Subjective: No events overnight.   Objective: Filed Vitals:   11/30/13 2111 12/01/13 0449 12/01/13 0539 12/01/13 0839  BP: 136/77 172/84 158/82   Pulse: 72 66    Temp: 98.1 F (36.7 C) 97.8 F (36.6 C)    TempSrc: Oral Oral    Resp: 18 18    Height:      Weight:      SpO2: 100% 100%  99%     Intake/Output Summary (Last 24 hours) at 12/01/13 1215 Last data filed at 12/01/13 8333  Gross per 24 hour  Intake    222 ml  Output      0 ml  Net    222 ml    Exam:   General:  Pt is alert, follows commands appropriately, not in acute distress  Cardiovascular: Regular rate and rhythm, S1/S2, no murmurs, no rubs, no gallops  Respiratory: Diminished air movement at bases   Abdomen: Soft, non tender, non distended, bowel sounds present, no guarding  Extremities: No edema, pulses DP and PT palpable bilaterally  Data Reviewed: Basic Metabolic Panel:  Recent Labs Lab 11/27/13 1231 11/27/13 1730 11/28/13 0700 11/29/13 0530 11/30/13 0540 12/01/13 0650  NA 141  --  140 142 144 141  K 4.3  --  4.4 4.4 4.3 4.1  CL 95*  --  101 103 102 100  CO2 24  --  _0 GLUCOSE 87  --  146* 144* 111* 144*  BUN 13  --  _1 CREATININE 0.83 0.83 0.76 0.72 0.82 0.80  CALCIUM 10.1  --  9.1 8.8 8.9 9.3  MG  --   --  1.2* 1.3* 1.5 1.4*   Liver Function Tests:  Recent Labs Lab 11/27/13 1231 11/28/13 0700 11/29/13 0530 11/30/13 0540 12/01/13 0650  AST 33 _2 ALT _3 ALKPHOS 72 58 53 50 54  BILITOT 0.5 0.5 0.3  0.3 0.4  PROT 8.6* 7.0 6.5 6.4 7.0  ALBUMIN 4.3 3.4* 3.2* 3.2* 3.3*    Recent Labs Lab 11/27/13 1231  LIPASE 57   CBC:  Recent Labs Lab 11/27/13 1231 11/27/13 1730 11/28/13 0700 11/29/13 0530 11/30/13 0540 12/01/13 0650  WBC 7.9 6.3 5.6 7.4 8.0 7.3  NEUTROABS 4.8  --  4.6 5.9 5.4 6.1  HGB 13.3 11.5* 10.9* 10.2* 9.9* 10.8*  HCT 40.7 36.0 33.7* 32.0* 30.4* 33.3*  MCV 90.0 90.9 90.1 90.1 90.5 90.2  PLT 185 179 188 172 166 172   Cardiac Enzymes:  Recent Labs Lab 11/27/13 1730 11/27/13 2350  TROPONINI <0.30 <0.30   CBG:  Recent Labs Lab 11/30/13 1158 11/30/13 1652 11/30/13 2108 12/01/13 0751 12/01/13 1158  GLUCAP 85 105* 141* 124* 117*    Recent Results (from the past 240 hour(s))  RESPIRATORY VIRUS  PANEL     Status: Abnormal   Collection Time    11/27/13 10:00 PM      Result Value Ref Range Status   Source - RVPAN NASOPHARYNGEAL   Final   Respiratory Syncytial Virus A NOT DETECTED   Final   Respiratory Syncytial Virus B NOT DETECTED   Final   Influenza A NOT DETECTED   Final   Influenza B DETECTED (*)  Final   Parainfluenza 1 NOT DETECTED   Final   Parainfluenza 2 NOT DETECTED   Final   Parainfluenza 3 NOT DETECTED   Final   Metapneumovirus NOT DETECTED   Final   Rhinovirus NOT DETECTED   Final   Adenovirus NOT DETECTED   Final   Influenza A H1 NOT DETECTED   Final   Influenza A H3 NOT DETECTED   Final   Comment: (NOTE)           Normal Reference Range for each Analyte: NOT DETECTED     Testing performed using the Luminex xTAG Respiratory Viral Panel test     kit.     This test was developed and its performance characteristics determined     by [facility]. It has not been cleared or approved by the [place]     Food and Drug Administration. This test is used for clinical purposes.     It should not be regarded as investigational or for research. This     laboratory is certified under the [place] (CLIA) as qualified to perform high complexity     clinical laboratory testing.     Performed at [facility]     Scheduled Meds: . aspirin EC  81 mg Oral Daily  . budesonide-formoterol  2 puff Inhalation BID  . enoxaparin (LOVENOX) injection  40 mg Subcutaneous Q24H  . fish oil-omega-3 fatty acids  2 g Oral Daily  . hydrochlorothiazide  25 mg Oral Daily   And  . irbesartan  300 mg Oral Daily  . insulin aspart  0-9 Units Subcutaneous TID WC  . methylPREDNISolone (SOLU-MEDROL) injection  80 mg Intravenous Q12H  . multivitamin with minerals  1 tablet Oral Daily  . pantoprazole  80 mg Oral Daily  . simvastatin  20 mg Oral q1800  . travoprost (benzalkonium)  1 drop Both Eyes QHS   Continuous Infusions:  Faye Ramsay, MD  [facility] Pager [phone]  If 7PM-7AM, please contact night-coverage [email or web address] Password [email or web address] 12/01/2013, 12:15 PM   LOS: 4 days

## 2013-12-02 ENCOUNTER — Telehealth: Payer: Self-pay | Admitting: Family Medicine

## 2013-12-02 LAB — CBC WITH DIFFERENTIAL/PLATELET
Basophils Absolute: 0 10*3/uL (ref 0.0–0.1)
Basophils Relative: 0 % (ref 0–1)
Eosinophils Absolute: 0 10*3/uL (ref 0.0–0.7)
Eosinophils Relative: 0 % (ref 0–5)
HEMATOCRIT: 31.7 % — AB (ref 36.0–46.0)
Hemoglobin: 10.3 g/dL — ABNORMAL LOW (ref 12.0–15.0)
LYMPHS ABS: 1.5 10*3/uL (ref 0.7–4.0)
LYMPHS PCT: 13 % (ref 12–46)
MCH: 29.4 pg (ref 26.0–34.0)
MCHC: 32.5 g/dL (ref 30.0–36.0)
MCV: 90.6 fL (ref 78.0–100.0)
Monocytes Absolute: 0.9 10*3/uL (ref 0.1–1.0)
Monocytes Relative: 8 % (ref 3–12)
NEUTROS ABS: 9.6 10*3/uL — AB (ref 1.7–7.7)
Neutrophils Relative %: 80 % — ABNORMAL HIGH (ref 43–77)
Platelets: 171 10*3/uL (ref 150–400)
RBC: 3.5 MIL/uL — AB (ref 3.87–5.11)
RDW: 13.6 % (ref 11.5–15.5)
WBC: 12 10*3/uL — AB (ref 4.0–10.5)

## 2013-12-02 LAB — COMPREHENSIVE METABOLIC PANEL
ALT: 18 U/L (ref 0–35)
AST: 20 U/L (ref 0–37)
Albumin: 3.1 g/dL — ABNORMAL LOW (ref 3.5–5.2)
Alkaline Phosphatase: 52 U/L (ref 39–117)
BILIRUBIN TOTAL: 0.3 mg/dL (ref 0.3–1.2)
BUN: 20 mg/dL (ref 6–23)
CHLORIDE: 102 meq/L (ref 96–112)
CO2: 30 mEq/L (ref 19–32)
CREATININE: 0.78 mg/dL (ref 0.50–1.10)
Calcium: 9.1 mg/dL (ref 8.4–10.5)
GFR calc Af Amer: >90 mL/min (ref >90)
GFR calc non Af Amer: 82 mL/min — ABNORMAL LOW (ref >90)
Glucose, Bld: 125 mg/dL — ABNORMAL HIGH (ref 70–99)
Potassium: 4.1 mEq/L (ref 3.7–5.3)
Sodium: 144 mEq/L (ref 137–147)
Total Protein: 6.6 g/dL (ref 6.0–8.3)

## 2013-12-02 LAB — GLUCOSE, CAPILLARY
GLUCOSE-CAPILLARY: 150 mg/dL — AB (ref 70–99)
GLUCOSE-CAPILLARY: 106 mg/dL — AB (ref 70–99)
GLUCOSE-CAPILLARY: 132 mg/dL — AB (ref 70–99)
Glucose-Capillary: 128 mg/dL — ABNORMAL HIGH (ref 70–99)
Glucose-Capillary: 112 mg/dL — ABNORMAL HIGH (ref 70–99)

## 2013-12-02 LAB — MAGNESIUM: MAGNESIUM: 1.8 mg/dL (ref 1.5–2.5)

## 2013-12-02 MED ORDER — PREDNISONE 20 MG PO TABS
40.0000 mg | ORAL_TABLET | Freq: Two times a day (BID) | ORAL | Status: DC
  Administered 2013-12-03: 40 mg via ORAL
  Filled 2013-12-02 (×3): qty 2

## 2013-12-02 NOTE — Progress Notes (Signed)
Physical Therapy Treatment Patient Details Name: ALASKA FLETT MRN: [id number] DOB: [birth year] Today's Date: 12/23/2013    History of Present Illness Admitted with approx. 1 week h/o progressive SOB.    PT Comments    Pt doing well with mobility and no further PT needed.    Follow Up Recommendations  No PT follow up     Equipment Recommendations  None recommended by PT    Recommendations for Other Services       Precautions / Restrictions Precautions Precautions: None    Mobility  Bed Mobility                  Transfers Overall transfer level: Independent                  Ambulation/Gait Ambulation/Gait assistance: Modified independent (Device/Increase time) Ambulation Distance (Feet): 300 Feet Assistive device: None Gait Pattern/deviations: WFL(Within Functional Limits) Gait velocity: slow   General Gait Details: Multiple standing rest breaks to catch her breath.   Stairs            Wheelchair Mobility    Modified Rankin (Stroke Patients Only)       Balance Overall balance assessment: No apparent balance deficits (not formally assessed)                                  Cognition Arousal/Alertness: Awake/alert Behavior During Therapy: WFL for tasks assessed/performed Overall Cognitive Status: Within Functional Limits for tasks assessed                      Exercises      General Comments        Pertinent Vitals/Pain See flow sheet    Home Living                      Prior Function            PT Goals (current goals can now be found in the care plan section) Progress towards PT goals: Goals met/education completed, patient discharged from PT    Frequency       PT Plan Other (comment) (PT dc'd.)    End of Session Equipment Utilized During Treatment: Oxygen Activity Tolerance: Patient tolerated treatment well Patient left: in chair;with call bell/phone within reach      Time: 7893-8101 PT Time Calculation (min): 18 min  Charges:  $Gait Training: 8-22 mins                    G Codes:      Joette Schmoker 2013-12-23, 4:01 PM  [employer] PT [phone]

## 2013-12-02 NOTE — Discharge Summary (Addendum)
Physician Discharge Summary  Margaret Adkins TMA:[id number] DOB: 12-Oct-1940 DOA: 11/27/2013  PCP: Vikki Ports, MD  Admit date: 11/27/2013 Discharge date: 12/03/2013  Time spent: 40 minutes  Recommendations for Outpatient Follow-up:   UIP  -Although the community is split on efficacy since patient is having a significant decline in respiratory function will start prednisolone 40 mg IV daily, if effective would convert over to PO medication  -Will await pulmonology input for further treatment options such as additional immunosuppressant  -Patient has not had any signs or symptoms of true infection, negative fever, negative chills or shakes. Has had positive productive sputum (thick white) which would be consistent with her increasing UIP. Will hold on antibiotics/antiviral however if patient spikes a fever or infective leukocytosis would immediately start  -3/30 discharge patient on prednisone 40 mg BID -3/31 with Dr. Annamaria Boots (pulmonologist) in one week  Pulmonary interstitial fibrosis  -See UIP   Chronic bronchitis  -See UIP   HTN  -Within AHA guidelines  -Continue home medication   HLD  -Zocor 20 mg   Diabetes type 2 uncontrolled  -Resume pioglitazone metformin 15/500 by mouth twice a day while patient on steroids -Followup with PCP to titrate medication     Abdominal pain  -Discharge on tramadol for chronic RLQ pain.   -Discharge on by mouth Zofran 4 mg q 8hr PRN nausea     Discharge Diagnoses:  Principal Problem:   UIP (usual interstitial pneumonitis) Active Problems:   Type II or unspecified type diabetes mellitus without mention of complication, not stated as uncontrolled   Essential hypertension, benign   Pure hypercholesterolemia   GERD (gastroesophageal reflux disease)   Pulmonary interstitial fibrosis   Left-sided chest wall pain   Unspecified chronic bronchitis   Chest pain on breathing   Acute on chronic respiratory failure   Discharge Condition:  Stable  Diet recommendation: Diabetic  Filed Weights   11/27/13 1140 11/27/13 2053  Weight: 68.947 kg (152 lb) 68.8 kg (151 lb 10.8 oz)    History of present illness:  Margaret Adkins is a 73 y.o. BF PMHx pulmonary fibrosis/interstitial lung disease on home O2 3 L via [place], bronchiectasis, Hepatitis C, diabetes type 2, HTN, HLD, colon polyps.Presents for SOB. A church friend brought her in today. She has hx/o pulmonary fibrosis, on oxygen therapy currently, on daily Symbicort, just saw pulmonology a few weeks ago and says her prognosis has worsened. She is here for dyspnea. Symptoms include rapid breathing, can't catch her breath, already on 3L /oxygen per minute, has been having difficulty the last several days,sore throat, thick productive mucous, but no chest pain, no swelling in legs. Hospitalized for same out of town few months ago. ABG obtained while patient was on a Ventimask, patient treated with a DuoNeb felt better placed back on her home regimen of O2 3 L via [place] did well while sitting in the bed however ambulating SpO2 while on home regimen 02.to mid 80s. Per Dr. Fredia Sorrow (ED physician) consult to pulmonology placed. Patient last saw Dr. Baird Lyons (pulmonologist) on 11/06/2013; per his note felt this was UIP or planning to obtain updated CT and possibly start Perfenadone. For patient's left-sided chest wall pain obtain updated CXR start tramadol for pain, and obtain d-dimer to exclude PE. Which was negative. Patient states that at her last appointment with Dr. Baird Lyons (pulmonologist) on 11/06/2013; she increased her home O2 to 3 L via [place]/24 hours. 3/30 patient back on home O2 regimen of 3 L  via [place] and has been able to ambulate without desaturation 3/31 no evidence overnight patient ready for discharge   Procedures: Source - RVPAN NASOPHARYNGEAL  Respiratory Syncytial Virus A NOT DETECTED  Respiratory Syncytial Virus B NOT DETECTED  Influenza A NOT DETECTED  Influenza B  (Abnormal) DETECTED  Parainfluenza 1 NOT DETECTED  Parainfluenza 2 NOT DETECTED  Parainfluenza 3 NOT DETECTED  Metapneumovirus NOT DETECTED  Rhinovirus NOT DETECTED  Adenovirus NOT DETECTED  Influenza A H1 NOT DETECTED  Influenza A H3 NOT DETECTED  CXR 11/27/2013  Low lung volume portable frontal radiograph. Given this limitation,  similar interstitial lung disease (likely usual interstitial  pneumonitis). No evidence of acute superimposed process.   CT chest high-resolution 3/19 2015  1. 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  2. Atherosclerosis, including 2 vessel coronary artery disease.  Assessment for potential risk factor modification, dietary therapy  or pharmacologic therapy may be warranted, if clinically indicated.    Antibiotics     Consultation  Doree Fudge (Pulmonology/Critical Care)    Discharge Exam: Filed Vitals:   12/02/13 2054 12/03/13 0519 12/03/13 0600 12/03/13 1400  BP:  163/82 160/87 132/72  Pulse: 89 70 62 86  Temp:  98 F (36.7 C)  97.9 F (36.6 C)  TempSrc:  Oral  Oral  Resp: _0 Height:      Weight:      SpO2: 94% 98%  98%    General: A./O. x4, NAD now on 3 L O2 via [place] Cardiovascular: Regular rhythm and rate, negative murmurs rubs or gallops Respiratory: Improved air movement in all lung fields, continued diffuse crackles  Discharge Instructions       Future Appointments Provider Department Dept Phone   12/13/2013 9:45 AM Deneise Lever, MD [facility] [phone]   01/06/2014 2:30 PM Brand Males, MD [facility] [phone]       Medication List         albuterol 108 (90 BASE) MCG/ACT inhaler  Commonly known as:  PROVENTIL HFA;VENTOLIN HFA  Inhale 2 puffs into the lungs every 6 (six) hours as needed for wheezing or shortness of breath.     ALIVE ONCE DAILY WOMENS  50+ Tabs  Take by mouth.     MH MACULAR HEALTH PO  Take 1 capsule by mouth daily.     aspirin 81 MG EC tablet  Take 81 mg by mouth daily.     budesonide-formoterol 160-4.5 MCG/ACT inhaler  Commonly known as:  SYMBICORT  2 puffs then rinse mouth, twice daily     diphenhydrAMINE 25 mg capsule  Commonly known as:  BENADRYL  Take 25 mg by mouth every 6 (six) hours as needed for allergies.     fish oil-omega-3 fatty acids 1000 MG capsule  Take 2 g by mouth daily.     nitroGLYCERIN 0.4 MG SL tablet  Commonly known as:  NITROSTAT  Place 1 tablet (0.4 mg total) under the tongue every 5 (five) minutes as needed for chest pain.     olmesartan-hydrochlorothiazide 40-25 MG per tablet  Commonly known as:  BENICAR HCT  Take 1 tablet by mouth daily.     omeprazole 40 MG capsule  Commonly known as:  PRILOSEC  Take 1 capsule (40 mg total) by mouth daily.     pioglitazone-metformin 15-500 MG per tablet  Commonly known as:  ACTOPLUS MET  Take 1 tablet by  mouth 2 (two) times daily with a meal.     pravastatin 40 MG tablet  Commonly known as:  PRAVACHOL  Take 1 tablet (40 mg total) by mouth daily.     predniSONE 20 MG tablet  Commonly known as:  DELTASONE  Take 2 tablets (40 mg total) by mouth 2 (two) times daily with a meal.     traMADol 50 MG tablet  Commonly known as:  ULTRAM  Take 50 mg by mouth every 6 (six) hours as needed for moderate pain. 1 or 2 every 6 hours if needed for pain     travoprost (benzalkonium) 0.004 % ophthalmic solution  Commonly known as:  TRAVATAN  Place 1 drop into both eyes at bedtime.       Allergies  Allergen Reactions  . Codeine Rash   Follow-up Information   Follow up with Deneise Lever, MD On 12/13/2013. (9:45am )    Specialty:  Pulmonary Disease   Contact information:   [address]  [facility] [place] [address] [phone]       Follow up with [facility], MD On 01/06/2014. (230pm )    Specialty:  Pulmonary  Disease   Contact information:   [place] [address] [phone]       Follow up with KNAPP,EVE A, MD In 2 weeks. [facility] followup, monitor patient's blood sugar level secondary to being on steroids)    Specialty:  Family Medicine   Contact information:   [place] [address] [phone]        The results of significant diagnostics from this hospitalization (including imaging, microbiology, ancillary and laboratory) are listed below for reference.    Significant Diagnostic Studies: Dg Chest 2 View  11/27/2013   CLINICAL DATA:  Short of breath. Weakness. Hypertension and diabetic.  EXAM: CHEST  2 VIEW  COMPARISON:  CT CHEST HIGH RESOLUTION W/O CM dated 11/19/2013; DG CHEST 2 VIEW dated 11/06/2013  FINDINGS: Diminished lung volumes on the frontal radiograph, secondary to AP portable technique. Cardiomegaly accentuated by AP portable technique. No definite pleural fluid. Given differences in lung volumes, similar appearance of interstitial lung disease. No acute superimposed airspace consolidation. No congestive failure. Cholecystectomy clips.  IMPRESSION: Low lung volume portable frontal radiograph. 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 No evidence of acute superimposed process.   Electronically Signed   By: Abigail Miyamoto M.D.   On: 11/27/2013 12:45   Dg Chest 2 View  11/06/2013   CLINICAL DATA:  Pulmonary fibrosis.  Cough and shortness of breath.  EXAM: CHEST  2 VIEW  COMPARISON:  11/30/2012  FINDINGS: Heart size is moderately enlarged. There is no pleural effusion or edema.  Diffuse bilateral reticular interstitial abnormality with areas of scarring and bronchiectasis are noted compatible with advanced pulmonary fibrosis. No superimposed airspace disease identified.  IMPRESSION: Progression of interstitial disease.   Electronically Signed   By: Kerby Moors M.D.   On: 11/06/2013 11:41   Ct  Chest High Resolution  11/19/2013   CLINICAL DATA:  Worsening shortness of breath on exertion. On oxygen. Evaluate for interstitial lung disease.  EXAM: CHEST CT WITHOUT CONTRAST  TECHNIQUE: Multidetector CT imaging of the chest was performed following the standard protocol without intravenous contrast. High resolution imaging of the lungs, as well as inspiratory and expiratory imaging, was performed.  COMPARISON:  Chest CT 10/12/2012.  FINDINGS: Mediastinum: Heart size is mildly enlarged. There is atherosclerosis of the thoracic  aorta, the great vessels of the mediastinum and the coronary arteries, including calcified atherosclerotic plaque in the left circumflex and right coronary arteries. Multiple borderline enlarged mediastinal lymph nodes are nonspecific, but similar to the prior examination. Please note that accurate exclusion of hilar adenopathy is limited on noncontrast CT scans. Esophagus is unremarkable in appearance.  Lungs/Pleura: In a patchy distribution throughout the lungs bilaterally there are areas of worsening subpleural reticulation, parenchymal banding, traction bronchiectasis and developing honeycombing. There is no clearly defined craniocaudal gradient at this time, although the most severe involvement does appear to be in the inferior aspects of the lower lobes of the lungs bilaterally. Inspiratory and expiratory imaging is unremarkable. No acute consolidative airspace disease. No pleural effusions.  Upper Abdomen: Unremarkable.  Musculoskeletal: There are no aggressive appearing lytic or blastic lesions noted in the visualized portions of the skeleton.  IMPRESSION: 1. Progressive worsening interstitial lung disease with imaging characteristics most compatible with usual interstitial pneumonia (UIP), as detailed above. 2. Atherosclerosis, including 2 vessel coronary artery disease. Assessment for potential risk factor modification, dietary therapy or pharmacologic therapy may be warranted,  if clinically indicated.   Electronically Signed   By: Vinnie Langton M.D.   On: 11/19/2013 15:37    Microbiology: Recent Results (from the past 240 hour(s))  RESPIRATORY VIRUS PANEL     Status: Abnormal   Collection Time    11/27/13 10:00 PM      Result Value Ref Range Status   Source - RVPAN NASOPHARYNGEAL   Final   Respiratory Syncytial Virus A NOT DETECTED   Final   Respiratory Syncytial Virus B NOT DETECTED   Final   Influenza A NOT DETECTED   Final   Influenza B DETECTED (*)  Final   Parainfluenza 1 NOT DETECTED   Final   Parainfluenza 2 NOT DETECTED   Final   Parainfluenza 3 NOT DETECTED   Final   Metapneumovirus NOT DETECTED   Final   Rhinovirus NOT DETECTED   Final   Adenovirus NOT DETECTED   Final   Influenza A H1 NOT DETECTED   Final   Influenza A H3 NOT DETECTED   Final   Comment: (NOTE)           Normal Reference Range for each Analyte: NOT DETECTED     Testing performed using the Luminex xTAG Respiratory Viral Panel test     kit.     This test was developed and its performance characteristics determined     by [employer]. It has not been cleared or approved by the [place]     Food and Drug Administration. This test is used for clinical purposes.     It should not be regarded as investigational or for research. This     laboratory is certified under the [place] (CLIA) as qualified to perform high complexity     clinical laboratory testing.     Performed at [facility]: Basic Metabolic Panel:  Recent Labs Lab 11/29/13 0530 11/30/13 0540 12/01/13 0650 12/02/13 0608 12/03/13 0604  NA 142 144 141 144 142  K 4.4 4.3 4.1 4.1 4.2  CL 103 102 100 102 99  CO2 _0 GLUCOSE 144* 111* 144* 125* 156*  BUN _1 27*  CREATININE 0.72 0.82 0.80 0.78 0.79  CALCIUM 8.8 8.9 9.3 9.1 9.6  MG 1.3* 1.5 1.4* 1.8 1.7  Liver Function Tests:  Recent Labs Lab 11/29/13 0530  11/30/13 0540 12/01/13 0650 12/02/13 0608 12/03/13 0604  AST _0 ALT _1 ALKPHOS 53 50 54 52 55  BILITOT 0.3 0.3 0.4 0.3 0.4  PROT 6.5 6.4 7.0 6.6 6.9  ALBUMIN 3.2* 3.2* 3.3* 3.1* 3.2*    Recent Labs Lab 11/27/13 1231  LIPASE 57   No results found for this basename: AMMONIA,  in the last 168 hours CBC:  Recent Labs Lab 11/29/13 0530 11/30/13 0540 12/01/13 0650 12/02/13 0608 12/03/13 0604  WBC 7.4 8.0 7.3 12.0* 9.9  NEUTROABS 5.9 5.4 6.1 9.6* 8.3*  HGB 10.2* 9.9* 10.8* 10.3* 11.2*  HCT 32.0* 30.4* 33.3* 31.7* 34.4*  MCV 90.1 90.5 90.2 90.6 90.3  PLT 172 166 172 171 177   Cardiac Enzymes:  Recent Labs Lab 11/27/13 1730 11/27/13 2350  TROPONINI <0.30 <0.30   BNP: BNP (last 3 results)  Recent Labs  11/27/13 1231 11/27/13 1730  PROBNP 287.7* 226.3*   CBG:  Recent Labs Lab 12/02/13 1140 12/02/13 1704 12/02/13 2216 12/03/13 0756 12/03/13 1209  GLUCAP 128* 132* 112* 131* 128*       Signed:  Dia Crawford, MD [facility] [phone] pager

## 2013-12-02 NOTE — Progress Notes (Signed)
Physical Therapy Discharge Patient Details Name: Margaret Adkins MRN: [id number] DOB: Nov 08, 1940 Today's Date: 12/02/2013 Time: 9447-3958 PT Time Calculation (min): 18 min  Patient discharged from PT services secondary to goals met and no further PT needs identified.  Please see latest therapy progress note for current level of functioning and progress toward goals.    Progress and discharge plan discussed with patient and/or caregiver: Patient/Caregiver agrees with plan  GP     [facility] 12/02/2013, 4:04 PM

## 2013-12-02 NOTE — Progress Notes (Signed)
Per MD, okay for pt not to have iv site. steroids changing to PO meds.

## 2013-12-02 NOTE — Telephone Encounter (Signed)
PATIENT IS AWARE OF HER APPOINTMENT WITH THE HEPATITIS C CLINIC ON 12/16/13 @ [place], NP. [place] [phone]

## 2013-12-03 ENCOUNTER — Other Ambulatory Visit: Payer: Self-pay | Admitting: Family Medicine

## 2013-12-03 LAB — CBC WITH DIFFERENTIAL/PLATELET
BASOS PCT: 0 % (ref 0–1)
Basophils Absolute: 0 10*3/uL (ref 0.0–0.1)
Eosinophils Absolute: 0 10*3/uL (ref 0.0–0.7)
Eosinophils Relative: 0 % (ref 0–5)
HCT: 34.4 % — ABNORMAL LOW (ref 36.0–46.0)
HEMOGLOBIN: 11.2 g/dL — AB (ref 12.0–15.0)
LYMPHS ABS: 1.3 10*3/uL (ref 0.7–4.0)
Lymphocytes Relative: 13 % (ref 12–46)
MCH: 29.4 pg (ref 26.0–34.0)
MCHC: 32.6 g/dL (ref 30.0–36.0)
MCV: 90.3 fL (ref 78.0–100.0)
Monocytes Absolute: 0.3 10*3/uL (ref 0.1–1.0)
Monocytes Relative: 3 % (ref 3–12)
NEUTROS PCT: 84 % — AB (ref 43–77)
Neutro Abs: 8.3 10*3/uL — ABNORMAL HIGH (ref 1.7–7.7)
Platelets: 177 10*3/uL (ref 150–400)
RBC: 3.81 MIL/uL — AB (ref 3.87–5.11)
RDW: 13.7 % (ref 11.5–15.5)
WBC: 9.9 10*3/uL (ref 4.0–10.5)

## 2013-12-03 LAB — COMPREHENSIVE METABOLIC PANEL
ALT: 25 U/L (ref 0–35)
AST: 24 U/L (ref 0–37)
Albumin: 3.2 g/dL — ABNORMAL LOW (ref 3.5–5.2)
Alkaline Phosphatase: 55 U/L (ref 39–117)
BUN: 27 mg/dL — AB (ref 6–23)
CALCIUM: 9.6 mg/dL (ref 8.4–10.5)
CO2: 30 mEq/L (ref 19–32)
CREATININE: 0.79 mg/dL (ref 0.50–1.10)
Chloride: 99 mEq/L (ref 96–112)
GFR calc non Af Amer: 81 mL/min — ABNORMAL LOW (ref >90)
Glucose, Bld: 156 mg/dL — ABNORMAL HIGH (ref 70–99)
Potassium: 4.2 mEq/L (ref 3.7–5.3)
Sodium: 142 mEq/L (ref 137–147)
TOTAL PROTEIN: 6.9 g/dL (ref 6.0–8.3)
Total Bilirubin: 0.4 mg/dL (ref 0.3–1.2)

## 2013-12-03 LAB — MAGNESIUM: Magnesium: 1.7 mg/dL (ref 1.5–2.5)

## 2013-12-03 LAB — GLUCOSE, CAPILLARY
GLUCOSE-CAPILLARY: 128 mg/dL — AB (ref 70–99)
Glucose-Capillary: 131 mg/dL — ABNORMAL HIGH (ref 70–99)

## 2013-12-03 MED ORDER — METFORMIN HCL 500 MG PO TABS
500.0000 mg | ORAL_TABLET | Freq: Two times a day (BID) | ORAL | Status: DC
  Filled 2013-12-03 (×2): qty 1

## 2013-12-03 MED ORDER — ONDANSETRON HCL 4 MG PO TABS: 4.0000 mg | ORAL_TABLET | Freq: Three times a day (TID) | ORAL | Status: DC | PRN

## 2013-12-03 MED ORDER — PREDNISONE 20 MG PO TABS: 40.0000 mg | ORAL_TABLET | Freq: Two times a day (BID) | ORAL | Status: DC

## 2013-12-03 NOTE — Progress Notes (Signed)
Pt's B/P=163/82;;RECHEKED  B/P=160/87;MD on call M.YORK was called  & made aware;no further orders received.

## 2013-12-03 NOTE — Progress Notes (Signed)
Nsg Discharge Note  Admit Date:  11/27/2013 Discharge date: 12/03/2013   ETHELYN CERNIGLIA to be D/C'd Home per MD order.  AVS completed.  Copy for chart, and copy for patient signed, and dated. Patient/caregiver able to verbalize understanding.  Discharge Medication:   Medication List         albuterol 108 (90 BASE) MCG/ACT inhaler  Commonly known as:  PROVENTIL HFA;VENTOLIN HFA  Inhale 2 puffs into the lungs every 6 (six) hours as needed for wheezing or shortness of breath.     ALIVE ONCE DAILY WOMENS 50+ Tabs  Take by mouth.     MH MACULAR HEALTH PO  Take 1 capsule by mouth daily.     aspirin 81 MG EC tablet  Take 81 mg by mouth daily.     budesonide-formoterol 160-4.5 MCG/ACT inhaler  Commonly known as:  SYMBICORT  2 puffs then rinse mouth, twice daily     diphenhydrAMINE 25 mg capsule  Commonly known as:  BENADRYL  Take 25 mg by mouth every 6 (six) hours as needed for allergies.     fish oil-omega-3 fatty acids 1000 MG capsule  Take 2 g by mouth daily.     nitroGLYCERIN 0.4 MG SL tablet  Commonly known as:  NITROSTAT  Place 1 tablet (0.4 mg total) under the tongue every 5 (five) minutes as needed for chest pain.     olmesartan-hydrochlorothiazide 40-25 MG per tablet  Commonly known as:  BENICAR HCT  Take 1 tablet by mouth daily.     omeprazole 40 MG capsule  Commonly known as:  PRILOSEC  Take 1 capsule (40 mg total) by mouth daily.     pioglitazone-metformin 15-500 MG per tablet  Commonly known as:  ACTOPLUS MET  Take 1 tablet by mouth 2 (two) times daily with a meal.     pravastatin 40 MG tablet  Commonly known as:  PRAVACHOL  Take 1 tablet (40 mg total) by mouth daily.     predniSONE 20 MG tablet  Commonly known as:  DELTASONE  Take 2 tablets (40 mg total) by mouth 2 (two) times daily with a meal.     traMADol 50 MG tablet  Commonly known as:  ULTRAM  Take 50 mg by mouth every 6 (six) hours as needed for moderate pain. 1 or 2 every 6 hours if needed  for pain     travoprost (benzalkonium) 0.004 % ophthalmic solution  Commonly known as:  TRAVATAN  Place 1 drop into both eyes at bedtime.        Discharge Assessment: Filed Vitals:   12/03/13 1400  BP: 132/72  Pulse: 86  Temp: 97.9 F (36.6 C)  Resp: 18   Skin clean, dry and intact without evidence of skin break down, no evidence of skin tears noted. IV catheter discontinued intact. Site without signs and symptoms of complications - no redness or edema noted at insertion site, patient denies c/o pain - only slight tenderness at site.  Dressing with slight pressure applied.  D/c Instructions-Education: Discharge instructions given to patient/family with verbalized understanding. D/c education completed with patient/family including follow up instructions, medication list, d/c activities limitations if indicated, with other d/c instructions as indicated by MD - patient able to verbalize understanding, all questions fully answered. Patient instructed to return to ED, call 911, or call MD for any changes in condition.  Patient escorted via [place], and D/C home via private auto.  Dayle Points, RN 12/03/2013 4:03 PM

## 2013-12-04 ENCOUNTER — Telehealth: Payer: Self-pay | Admitting: Internal Medicine

## 2013-12-04 NOTE — Telephone Encounter (Signed)
Is this a follow up with Dr. Samuel Germany or someone else?  The last note in the computer is where she saw Dr. Samuel Germany in late 2014.   It appeared that they discussed treatment options, but I don't believe a treatment course for Hep C was started.   Is this correct?  I would recommend a hospital f/u here to review what was recently done and foreseeable next steps with her general care (29min f/u appt).

## 2013-12-04 NOTE — Telephone Encounter (Signed)
Pt called stating while she was in the hospital she had her liver and kidneys and everything checked. She is suppose to go the liver doctor Monday to get tested and wants to know does she still need to go

## 2013-12-05 NOTE — Telephone Encounter (Signed)
Advised that she should keep her appointment regarding Hept C per [name]. [facility]

## 2013-12-13 ENCOUNTER — Ambulatory Visit (INDEPENDENT_AMBULATORY_CARE_PROVIDER_SITE_OTHER): Payer: Medicare Other | Admitting: Internal Medicine

## 2013-12-13 ENCOUNTER — Encounter: Payer: Self-pay | Admitting: Internal Medicine

## 2013-12-13 VITALS — BP 140/84 | HR 83 | Ht 63.0 in | Wt 157.4 lb

## 2013-12-13 DIAGNOSIS — J9620 Acute and chronic respiratory failure, unspecified whether with hypoxia or hypercapnia: Secondary | ICD-10-CM

## 2013-12-13 DIAGNOSIS — J8410 Pulmonary fibrosis, unspecified: Secondary | ICD-10-CM

## 2013-12-13 NOTE — Progress Notes (Signed)
10/04/12- 33 yoF former smoker referred by [name], PA. She describes shortness of breath x 6 months with no prior respiratory complaint. Pain left posterior lateral ribs for about the same interval, related to deep breath and twisting. Chronic cough with scant thick white sputum. Occasional night sweats without weight change or adenopathy. No blood or fever and nothing purulent. Has been anemic. History of hepatitis C. She has Ventolin and Symbicort used unevenly. She is unclear that they help. PFT 08/22/12- severe restriction, mild obstruction with resp to BD, DLCo severely reduced. FEV1 0.91/48%, FVC 1.12/ 41%, FEV1/FVC 0.81, FEF 25-75% 1.01/ 46%, TLC 46%, DLCO 28% CXR 11/17/11 IMPRESSION:  1. No acute cardia no pulmonary abnormalities.  2. Chronic interstitial changes, similar to previous exam.  Original Report Authenticated By: Angelita Ingles, M.D. MR chest 09/24/10 IMPRESSION:  1. 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 Although possibly from severe degenerative  arthropathy, the edema in the joint and adjacent osseous structures  raises the possibility of septic joint and osteomyelitis. No extra-  articular fluid collection is identified to provide greater  positive predictive value for septic joint. Correlate with  chronicity in onset of symptoms as well as other signs of infection  in determining the need for treatment or attempt at arthrocentesis.  2. Suspected atypical infectious bronchiolitis in the right lung  apex.  Provider: Weyman Rodney CT chest 07/20/10 IMPRESSION: Multifocal areas of peribronchial opacities involving  the upper lobes along with diffuse bronchial wall thickening is  most compatible with bronchopneumonia.  Provider: Elizabeth Sauer  11/06/12- 71 yoF former smoker referred by [name], PA. FOLLOWS FOR: has not had 6MW test done;  Increased SOB-getting worse. Admits she has been struggling along with shortness of breath with exertion probably several months. An intermittent, somewhat pleuritic pain, left lateral chest is not new but is increased when she coughs. Notices frequent coughing spells with thick white sputum. Denies fever, blood, adenopathy, swelling, anginal pain or palpitation. PFT: 08/22/2012 severe restriction of total lung capacity, mild obstructive airways disease in small airways with response to bronchodilator, diffusion severely reduced. FVC 1.12/41%, FEV1 0.91/48%, FEV1/FVC 0.81, FEF 25-75% 1.01/46%. TLC 46%, DLCO 28%. CT chest 10/17/12 IMPRESSION:  1. Chronic interstitial changes of scarring, reticulation, and  cylindrical type bronchiectasis noted. Although there is no zonal  predominance or evidence of frank honeycombing these findings are  suspicious for early usual interstitial pneumonitis.  Original Report Authenticated By: Kerby Moors, M.D. L Rib Xray 09/03/12- NEG ANA neg, Sed rate 15  12/13/12-71 yoF former smoker followed for Interstitial lung disease/ fibrosis/ bronchiectasis.   PCP [name], PA. FOLLOWS FOR: review ONO and 6MW test results with patient.  Pt states breathing is better since last OV. Pt would like to have Rx for Tussionex. Still coughing. Denies seasonal allergy. Sputum cultures from 11/06/2012 all negative so far with normal flora. Overnight oximetry 11/07/2012- adequate, not qualify for O2 during sleep. 6MWT- 12/13/12- 94%, 79%, 96%, 447 m. Desaturated during exertion. Good recovery.  06/13/13- 81 yoF former smoker followed for Interstitial lung disease/ fibrosis/bronchiectasis, complicated by hepatitis C.   PCP [name], PA FOLLOWS FOR: chest tightness, increased wheezing; needs surgery clearance for cataract surgery(anticipated date is 06-25-13) Gradually more dyspnea with exertion stops her every couple of rooms. Still on home oxygen prescribed from [facility]  clinic for sleep and exertion when she was there this summer. They diagnosed pulmonary fibrosis with no  biopsy. Cough productive thick white sputum, no fever, blood or chest pain. Tussionex not much help for her cough. We reviewed images on disc from [facility]  11/06/13- 72 yoF former smoker followed for Interstitial lung disease/ fibrosis/bronchiectasis, complicated by hepatitis C.   PCP [name], PA FOLLOWS FOR: continues to have increased SOB even with O2 on.  CT reported in dc summary from [facility] 04/21/13 had referred to progression of pulmonary fibrosis, unspecified, negative for PE.  ANA and Sed rate Neg. WNL 09/2012. Has been w family in [place] through winter and Rx'd there for pneumonia outpt. Continues O2 2L/ [employer].  C/O persistent pain L post lat ribs x 3-4 months, worse w deep breath, never gone. Rx'd Aleve. No fever. Cough prod thick white. No palpitation, leg pain, ankle edema, nodes or masses.   12/13/13- 82 yoF former smoker followed for Interstitial lung disease/ UIP/ bronchiectasis, complicated by hepatitis C.   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 Discharged on prednisone 40 mg daily. Still coughing, productive clear mucus. CT c/w UIP- images reviewed w/ her.  Now has O2 4l/ [employer]. Asks portable concentrator. Going oot to funeral for brother (lung Ca). Hx Hep C with normal recent liver and renal function. We had made referral appointment to discuss management of UIP with Dr Chase Caller- pending. CT chest 11/19/13 IMPRESSION:  1. 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  2. Atherosclerosis, including 2 vessel coronary artery disease.  Assessment for potential risk factor modification, dietary therapy  or pharmacologic therapy may be warranted, if clinically indicated.  Electronically  Signed  By: Vinnie Langton M.D.  On: 11/19/2013 15:37   ROS-see HPI Constitutional:   No-   weight loss, night sweats, fevers, chills, fatigue, lassitude. HEENT:   No-  headaches, difficulty swallowing, tooth/dental problems, sore throat,       No-  sneezing, itching, ear ache, nasal congestion, post nasal drip,  CV:  No-   chest pain, orthopnea, PND, swelling in lower extremities, anasarca, dizziness, palpitations Resp: + shortness of breath with exertion or at rest.              + productive cough,  + non-productive cough,  No- coughing up of blood.              No-   change in color of mucus.  No- wheezing.   Skin: No-   rash or lesions. GI:  No-   heartburn, indigestion,  abdominal pain, nausea, vomiting, GU:  MS:  +joint pain or swelling, back pain. Neuro-     nothing unusual Psych:  No- change in mood or affect. No depression or anxiety.  No memory loss.  OBJ- Physical Exam General- Alert, Oriented, Affect-appropriate, Distress- none acute. Overweight. O2 4Lpulse 94% Skin- +steroid echmoses Lymphadenopathy- none Head- atraumatic            Eyes- Gross vision intact, PERRLA, conjunctivae and secretions clear            Ears- Hearing, canals-normal            Nose- Clear, no-Septal dev, mucus, polyps, erosion, perforation             Throat- Mallampati II , mucosa red , drainage- none, tonsils- atrophic Neck- flexible , trachea midline, no stridor , thyroid nl, carotid no bruit Chest - symmetrical excursion , unlabored,  Heart/CV- RRR , no murmur , no gallop  , no rub, nl s1 s2                           - JVD-none , edema- none, stasis changes- none, varices- none           Lung- +shallow, +faint basilar crackles, wheeze- none, cough+dry , dullness-none, rub- none. No crackles           Chest wall- no mass or crepitus Abd-  Br/ Gen/ Rectal- Not done, not indicated Extrem- cyanosis- none, clubbing, none, atrophy- none, strength- nl Neuro- grossly intact to  observation

## 2013-12-13 NOTE — Assessment & Plan Note (Addendum)
I have discussed available treatment with either perfenadone or Ofev. We have an appointment for second opinion consultation with Dr Chase Caller and she will keep that. She is diabetic and prednisone is not expected to help her lung disease- plan d/c prednisone.

## 2013-12-13 NOTE — Assessment & Plan Note (Signed)
Secondary to UIP. O2 dependent [employer] 4L

## 2013-12-13 NOTE — Patient Instructions (Signed)
Keep your appointment on May 4 to see pulmonologist Dr Chase Caller for his opinion about your pulmonary fibrosis/ UIP  Order- DME [employer] - portable O2 concentrator 4L/min     Dx Pulmonary Fibrosis  Ok to stop your prednisone. Your blood sugar will come down some off of this.

## 2013-12-16 ENCOUNTER — Other Ambulatory Visit: Payer: Self-pay | Admitting: Nurse Practitioner

## 2013-12-16 DIAGNOSIS — C220 Liver cell carcinoma: Secondary | ICD-10-CM

## 2013-12-17 ENCOUNTER — Inpatient Hospital Stay: Payer: Medicare Other | Admitting: Medical

## 2013-12-18 ENCOUNTER — Ambulatory Visit (INDEPENDENT_AMBULATORY_CARE_PROVIDER_SITE_OTHER): Payer: Medicare Other | Admitting: Medical

## 2013-12-18 ENCOUNTER — Encounter: Payer: Self-pay | Admitting: Medical

## 2013-12-18 ENCOUNTER — Telehealth: Payer: Self-pay | Admitting: Medical

## 2013-12-18 VITALS — BP 112/70 | HR 72 | Temp 97.6°F | Resp 16 | Wt 152.0 lb

## 2013-12-18 DIAGNOSIS — E119 Type 2 diabetes mellitus without complications: Secondary | ICD-10-CM

## 2013-12-18 DIAGNOSIS — I10 Essential (primary) hypertension: Secondary | ICD-10-CM

## 2013-12-18 DIAGNOSIS — Z9981 Dependence on supplemental oxygen: Secondary | ICD-10-CM

## 2013-12-18 DIAGNOSIS — J84112 Idiopathic pulmonary fibrosis: Secondary | ICD-10-CM

## 2013-12-18 DIAGNOSIS — B182 Chronic viral hepatitis C: Secondary | ICD-10-CM

## 2013-12-18 DIAGNOSIS — R011 Cardiac murmur, unspecified: Secondary | ICD-10-CM

## 2013-12-18 DIAGNOSIS — I2510 Atherosclerotic heart disease of native coronary artery without angina pectoris: Secondary | ICD-10-CM

## 2013-12-18 DIAGNOSIS — J8410 Pulmonary fibrosis, unspecified: Secondary | ICD-10-CM

## 2013-12-18 NOTE — Telephone Encounter (Signed)
Please call the hepatitis clinic [phone]  1) ask for their recent notes 2) see if they recommend we not use any of her current medications from a liver health standpoint.  Any concerns about aspirin, pravastatin?

## 2013-12-18 NOTE — Progress Notes (Signed)
Subjective:   Margaret Adkins is a 73 y.o. female presenting on 12/18/2013 with [place] VISIT and Medication Refill  Here for hospital f/u.   Last visit here I sent her to the ED due to dyspnea and worsening lung disease, exacerbation.  Since hospital visit, has seen both pulmonology and hepatitis clinic.  Hepatitis clinic just checked some labs, are sending her for abdomen CT imaging.   Saw pulmonology, will be seeing a different pulmonologist to inquire about new medication for the lung disease.  Will be starting medication for liver disease/hep C soon pending liver imaging.  Breathing lately about the same as usual.   On 4 L/min round the clock.    Lipomas - seeing surgeon about this, they are painful to her.   Compliant with her other medications for diabetes, BP, cholesterol as usual.  Checks glucose daily fasting, getting 110-120 regularly.  Eating healthy.    No other complaint.  Review of Systems ROS as in subjective      Objective:     Filed Vitals:   12/18/13 1126  BP: 112/70  Pulse: 72  Temp: 97.6 F (36.4 C)  Resp: 16    General appearance: alert, no distress, WD/WN, seated on oxygen nasal cannula 4L/min Oral cavity: MMM, no lesions Neck: supple, no lymphadenopathy, no thyromegaly, no masses Heart: faint 2/6 brief systolic murmur heard best in lower left sternal border, otherwise RRR, normal S1, S2 Lungs: decreased in general, but not dyspneic, no wheezes, rhonchi, or rales Abdomen: +bs, soft, non tender, non distended, no masses, no hepatomegaly, no splenomegaly Pulses: 2+ symmetric, upper and lower extremities, normal cap refill Ext: no  edema     Assessment: Encounter Diagnoses  Name Primary?  . UIP (usual interstitial pneumonitis) Yes  . On home oxygen therapy   . Chronic hepatitis C   . Type II or unspecified type diabetes mellitus without mention of complication, not stated as uncontrolled   . Essential hypertension, benign   .  CAD (coronary artery disease)   . Heart murmur      Plan: I have reviewed her recent hospital discharge summary, pulmonology notes, she has additional pulmonology second eval consult pending.  Will likely be starting trial medication for UIP.  She relatively stable on home oxygen at this time, compliant with Symbicort and albuterol.  Chronic hep C-followup with imaging and medication initiation through hepatitis clinic  Diabetes type 2, hyperlipidemia-reviewed her recent labs from the hospital, lipids and A1c at goal, recent glucose numbers okay, continue current medications  Hypertension-continue same medication  coronary artery disease/atherosclerosis noted on recent chest CT, murmur-we discussed these findings, but for now we will hold off on echocardiogram.  She is on medication for risk factor reduction for heart disease.  No prior cardiac evaluation other than EKG.  Given all that she has going on between pulmonology and liver clinic, we will defer this for later since she has no other new symptoms  She will return fasting 6 weeks labs  Margaret Adkins was seen today for follow up from hospital visit and medication refill.  Diagnoses and associated orders for this visit:  UIP (usual interstitial pneumonitis)  On home oxygen therapy  Chronic hepatitis C  Type II or unspecified type diabetes mellitus without mention of complication, not stated as uncontrolled - Microalbumin / creatinine urine ratio; Future - Hemoglobin A1c; Future - Basic metabolic panel; Future - Hepatic function panel; Future  Essential hypertension, benign  CAD (coronary artery disease)  Heart murmur    Return mid May for fasting labs.

## 2013-12-19 ENCOUNTER — Ambulatory Visit
Admission: RE | Admit: 2013-12-19 | Discharge: 2013-12-19 | Disposition: A | Discharge status: Home / Self Care | Payer: Medicare Other | Source: Ambulatory Visit | Attending: Nurse Practitioner | Admitting: Nurse Practitioner

## 2013-12-19 ENCOUNTER — Other Ambulatory Visit: Payer: Self-pay | Admitting: Nurse Practitioner

## 2013-12-19 DIAGNOSIS — C220 Liver cell carcinoma: Secondary | ICD-10-CM

## 2013-12-19 NOTE — Telephone Encounter (Signed)
LM to [facility] Answering machine states they should call back in 48hours.

## 2013-12-20 ENCOUNTER — Telehealth: Payer: Self-pay | Admitting: Internal Medicine

## 2013-12-20 NOTE — Telephone Encounter (Signed)
Nothing to do until audit ends

## 2013-12-20 NOTE — Telephone Encounter (Signed)
Spoke with Butch Penny Nurse practioner at [facility], she states that there is no concerns with her current meds and that she will fax over notes. She states that depending on her labs, geneotype and her fibrosis score will depend if she gets set up for treatment and depending on her results if insurance will pay for it also. If you need to call her call her direct line @ [phone]

## 2013-12-20 NOTE — Telephone Encounter (Signed)
Spoke with pt and she states that Margaret Adkins told her they cannot get her the portable concentrator at this time because she is in a Development worker, community.  Spoke with Alyce at [facility] and she verified that this is correct and she states that Medicare will not approve for any new equipment while an audit is being done and they have no way of knowing how long it will take.  Please advise if anything further to do for pt.

## 2013-12-23 ENCOUNTER — Other Ambulatory Visit: Payer: Self-pay | Admitting: Family Medicine

## 2013-12-23 MED ORDER — PIOGLITAZONE HCL-METFORMIN HCL 15-500 MG PO TABS: 1.0000 | ORAL_TABLET | Freq: Two times a day (BID) | ORAL | Status: AC

## 2013-12-23 MED ORDER — PRAVASTATIN SODIUM 40 MG PO TABS: 40.0000 mg | ORAL_TABLET | Freq: Every day | ORAL | Status: AC

## 2013-12-23 MED ORDER — OMEPRAZOLE 40 MG PO CPDR: 40.0000 mg | DELAYED_RELEASE_CAPSULE | Freq: Every day | ORAL | Status: AC

## 2013-12-23 MED ORDER — DICLOFENAC SODIUM 1 % TD GEL: 4.0000 g | Freq: Four times a day (QID) | TRANSDERMAL | Status: DC

## 2013-12-23 NOTE — Telephone Encounter (Signed)
I called and made pt aware. Nothing further needed 

## 2013-12-24 ENCOUNTER — Other Ambulatory Visit: Payer: Self-pay

## 2013-12-24 DIAGNOSIS — Z1231 Encounter for screening mammogram for malignant neoplasm of breast: Secondary | ICD-10-CM

## 2014-01-06 ENCOUNTER — Ambulatory Visit (INDEPENDENT_AMBULATORY_CARE_PROVIDER_SITE_OTHER): Payer: Medicare Other | Admitting: Internal Medicine

## 2014-01-06 ENCOUNTER — Other Ambulatory Visit (INDEPENDENT_AMBULATORY_CARE_PROVIDER_SITE_OTHER): Payer: Medicare Other

## 2014-01-06 ENCOUNTER — Encounter: Payer: Self-pay | Admitting: Internal Medicine

## 2014-01-06 VITALS — BP 122/86 | HR 84 | Ht 63.0 in | Wt 154.0 lb

## 2014-01-06 DIAGNOSIS — J849 Interstitial pulmonary disease, unspecified: Secondary | ICD-10-CM

## 2014-01-06 DIAGNOSIS — J8410 Pulmonary fibrosis, unspecified: Secondary | ICD-10-CM

## 2014-01-06 DIAGNOSIS — I2510 Atherosclerotic heart disease of native coronary artery without angina pectoris: Secondary | ICD-10-CM

## 2014-01-06 LAB — SEDIMENTATION RATE: Sed Rate: 6 mm/hr (ref 0–22)

## 2014-01-06 NOTE — Progress Notes (Signed)
Subjective:    Patient ID: Margaret Adkins, female    DOB: 20-Sep-1940, 73 y.o.   MRN: [id number]  HPI  10/04/12- 73 yoF former smoker referred by [name], PA. She describes shortness of breath x 6 months with no prior respiratory complaint. Pain left posterior lateral ribs for about the same interval, related to deep breath and twisting. Chronic cough with scant thick white sputum. Occasional night sweats without weight change or adenopathy. No blood or fever and nothing purulent. Has been anemic. History of hepatitis C. She has Ventolin and Symbicort used unevenly. She is unclear that they help. PFT 08/22/12- severe restriction, mild obstruction with resp to BD, DLCo severely reduced. FEV1 0.91/48%, FVC 1.12/ 41%, FEV1/FVC 0.81, FEF 25-75% 1.01/ 46%, TLC 46%, DLCO 28% CXR 11/17/11 IMPRESSION:  1. No acute cardia no pulmonary abnormalities.  2. Chronic interstitial changes, similar to previous exam.  Original Report Authenticated By: Angelita Ingles, M.D. MR chest 09/24/10 IMPRESSION:  1. 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 Although possibly from severe degenerative  arthropathy, the edema in the joint and adjacent osseous structures  raises the possibility of septic joint and osteomyelitis. No extra-  articular fluid collection is identified to provide greater  positive predictive value for septic joint. Correlate with  chronicity in onset of symptoms as well as other signs of infection  in determining the need for treatment or attempt at arthrocentesis.  2. Suspected atypical infectious bronchiolitis in the right lung  apex.  Provider: Weyman Rodney CT chest 07/20/10 IMPRESSION: Multifocal areas of peribronchial opacities involving  the upper lobes along with diffuse bronchial wall thickening is  most compatible with bronchopneumonia.  Provider: Elizabeth Sauer  11/06/12- 73 yoF former smoker referred by [name], PA. FOLLOWS FOR: has not had 6MW test done; Increased SOB-getting worse. Admits she has been struggling along with shortness of breath with exertion probably several months. An intermittent, somewhat pleuritic pain, left lateral chest is not new but is increased when she coughs. Notices frequent coughing spells with thick white sputum. Denies fever, blood, adenopathy, swelling, anginal pain or palpitation. PFT: 08/22/2012 severe restriction of total lung capacity, mild obstructive airways disease in small airways with response to bronchodilator, diffusion severely reduced. FVC 1.12/41%, FEV1 0.91/48%, FEV1/FVC 0.81, FEF 25-75% 1.01/46%. TLC 46%, DLCO 28%. CT chest 10/17/12 IMPRESSION:  1. Chronic interstitial changes of scarring, reticulation, and  cylindrical type bronchiectasis noted. Although there is no zonal  predominance or evidence of frank honeycombing these findings are  suspicious for early usual interstitial pneumonitis.  Original Report Authenticated By: Kerby Moors, M.D. L Rib Xray 09/03/12- NEG ANA neg, Sed rate 15  12/13/12-73 yoF former smoker followed for Interstitial lung disease/ fibrosis/ bronchiectasis.   PCP [name], PA. FOLLOWS FOR: review ONO and 6MW test results with patient.  Pt states breathing is better since last OV. Pt would like to have Rx for Tussionex. Still coughing. Denies seasonal allergy. Sputum cultures from 11/06/2012 all negative so far with normal flora. Overnight oximetry 11/07/2012- adequate, not qualify for O2 during sleep. 6MWT- 12/13/12- 94%, 79%, 96%, 447 m. Desaturated during exertion. Good recovery.  06/13/13- 73 yoF former smoker followed for Interstitial lung disease/ fibrosis/bronchiectasis, complicated by hepatitis C.   PCP [name], PA FOLLOWS FOR: chest tightness, increased wheezing; needs surgery clearance for cataract surgery(anticipated date is  06-25-13) Gradually more dyspnea with exertion stops her every couple of  rooms. Still on home oxygen prescribed from [facility] clinic for sleep and exertion when she was there this summer. They diagnosed pulmonary fibrosis with no biopsy. Cough productive thick white sputum, no fever, blood or chest pain. Tussionex not much help for her cough. We reviewed images on disc from [facility] clinic  11/06/13- 73 yoF former smoker followed for Interstitial lung disease/ fibrosis/bronchiectasis, complicated by hepatitis C.   PCP [name], PA FOLLOWS FOR: continues to have increased SOB even with O2 on.  CT reported in dc summary from [facility] 04/21/13 had referred to progression of pulmonary fibrosis, unspecified, negative for PE.  ANA and Sed rate Neg. WNL 09/2012. Has been w family in [place] through winter and Rx'd there for pneumonia outpt. Continues O2 2L/ [employer].  C/O persistent pain L post lat ribs x 3-4 months, worse w deep breath, never gone. Rx'd Aleve. No fever. Cough prod thick white. No palpitation, leg pain, ankle edema, nodes or masses.   12/13/13- 73 yoF former smoker followed for Interstitial lung disease/ UIP/ bronchiectasis, complicated by hepatitis C.   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 Discharged on prednisone 40 mg daily. Still coughing, productive clear mucus. CT c/w UIP- images reviewed w/ her.  Now has O2 4l/ [employer]. Asks portable concentrator. Going oot to funeral for brother (lung Ca). Hx Hep C with normal recent liver and renal function. We had made referral appointment to discuss management of UIP with Dr Chase Caller- pending. CT chest 11/19/13 IMPRESSION:  1. 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  2. Atherosclerosis, including 2 vessel coronary artery disease.  Assessment for potential  risk factor modification, dietary therapy  or pharmacologic therapy may be warranted, if clinically indicated.  Electronically Signed  By: Vinnie Langton M.D.  On: 11/19/2013 15:37    OV 01/06/2014 -  ILD CONSULT with DR [facility]  Chief Complaint  Patient presents with  . Follow-up    Increased sob and cough non-productive x1 month    Referred by Dr. Tarri Fuller young for second opinion on pulmonary fibrosis specifically with question of starting IPF specific therapy  73 year old female ex-smoker without any exposure history. According to her history she was diagnosed with pulmonary fibrosis one year ago but review of the charts shows severe restrictive lung disease with reduced diffusion capacity even as far back as December 2013 with an FVC of 1.1 L/41%. FEV1 of 0.9 L or 48%. Ratio 79. Total lung capacity of 4.1/41%. Diffusion capacity  a 5.5/28%. CT scans of the chest reported by our radiologist here consistent with a diagnosis of UIP radiologically. She denies any collagen-vascular history diagnosis. However, notably that she's had ANA which is negative. She believes that she's been on oxygen for just over a year initially starting off at 2 L but has had progressive decline in functional status with worsening dyspnea and is currently on 4 L oxygen. A year ago she was visiting [place], [place] and it sounds like she had a flare up of her interstitial lung disease and she was admitted. 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 Conversation with Dr. Annamaria Boots in she's been recommended to see me for decision of starting therapy either Pirfenidone or OFEV.  She does have a history of hepatitis C and is currently on new  protease inhibitors; HORVANI. She showed me ultrasound reported social she is mild fibrosis of the liver. This was done at [facility] liver clinic. Subsequent to  The visit: I reviewed the drug  and have learnt that this is NOT a Cyp enzyme inhibitor/promoter that can alter Esbriet/OFev levels. Additionally, HORVANI is only a 120 intent to cure Rx. I spoke to her liver specialist and she asssures me that patient does NOT have cirrhosis which would be a contra-indication for IPF specific Rx   Lab review shows a creatinine of 0.8 mg percent with GFR of 81. Normal liver function enzymes with an albumin of 3.2 on 12/03/2013     has a past medical history of Colon polyps; Hepatitis C; Diverticulosis; Diabetes mellitus; Hypertension; GERD (gastroesophageal reflux disease); Osteoporosis; and Hyperlipidemia.   has past surgical history that includes Cholecystectomy (2004); Rotator cuff repair; Abdominal hysterectomy; lipoma removal; Breast biopsy; and Hemicolectomy.  Current outpatient prescriptions:albuterol (PROVENTIL HFA;VENTOLIN HFA) 108 (90 BASE) MCG/ACT inhaler, Inhale 2 puffs into the lungs every 6 (six) hours as needed for wheezing or shortness of breath., Disp: 18 g, Rfl: prn;  aspirin 81 MG EC tablet, Take 81 mg by mouth daily.  , Disp: , Rfl: ;  BENICAR HCT 40-25 MG per tablet, TAKE ONE TABLET BY MOUTH ONCE DAILY, Disp: 90 tablet, Rfl: 1 budesonide-formoterol (SYMBICORT) 160-4.5 MCG/ACT inhaler, 2 puffs then rinse mouth, twice daily, Disp: 1 Inhaler, Rfl: prn;  diclofenac sodium (VOLTAREN) 1 % GEL, Apply 4 g topically 4 (four) times daily., Disp: 100 g, Rfl: 1;  diphenhydrAMINE (BENADRYL) 25 mg capsule, Take 25 mg by mouth every 6 (six) hours as needed for allergies. , Disp: , Rfl: ;  fish oil-omega-3 fatty acids 1000 MG capsule, Take 2 g by mouth daily., Disp: , Rfl:  Ledipasvir-Sofosbuvir (HARVONI) 90-400 MG TABS, Take 1 tablet by mouth daily., Disp: , Rfl: ;  Multiple Vitamins-Minerals (ALIVE ONCE DAILY WOMENS 50+) TABS, Take by mouth., Disp: , Rfl: ;  Multiple Vitamins-Minerals (MH MACULAR HEALTH PO), Take 1 capsule by mouth daily., Disp: , Rfl: ;  omeprazole (PRILOSEC) 40 MG capsule,  Take 1 capsule (40 mg total) by mouth daily., Disp: 90 capsule, Rfl: 2 pioglitazone-metformin (ACTOPLUS MET) 15-500 MG per tablet, Take 1 tablet by mouth 2 (two) times daily with a meal., Disp: 180 tablet, Rfl: 2;  pravastatin (PRAVACHOL) 40 MG tablet, Take 1 tablet (40 mg total) by mouth daily., Disp: 90 tablet, Rfl: 2;  traMADol (ULTRAM) 50 MG tablet, Take 50 mg by mouth every 6 (six) hours as needed for moderate pain. 1 or 2 every 6 hours if needed for pain, Disp: , Rfl:  travoprost, benzalkonium, (TRAVATAN) 0.004 % ophthalmic solution, Place 1 drop into both eyes at bedtime.  , Disp: , Rfl: ;  [DISCONTINUED] omeprazole (PRILOSEC OTC) 20 MG tablet, Take 40 mg by mouth daily., Disp: , Rfl:    Review of Systems  Constitutional: Negative for fever and unexpected weight change.  HENT: Positive for congestion. Negative for dental problem, ear pain, nosebleeds, postnasal drip, rhinorrhea, sinus pressure, sneezing, sore throat and trouble swallowing.   Eyes: Negative for redness and itching.  Respiratory: Positive for cough, chest tightness and wheezing. Negative for shortness of breath.   Cardiovascular: Negative for palpitations and leg swelling.  Gastrointestinal: Negative for nausea and vomiting.  Genitourinary: Negative for dysuria.  Musculoskeletal: Negative for joint swelling.  Skin: Negative for rash.  Neurological: Negative for headaches.  Hematological: Does not bruise/bleed easily.  Psychiatric/Behavioral: Negative  for dysphoric mood. The patient is not nervous/anxious.        Objective:   Physical Exam  Vitals reviewed. Constitutional: She is oriented to person, place, and time. She appears well-developed and well-nourished. No distress.  HENT:  Head: Normocephalic and atraumatic.  Right Ear: External ear normal.  Left Ear: External ear normal.  Mouth/Throat: Oropharynx is clear and moist. No oropharyngeal exudate.  Eyes: Conjunctivae and EOM are normal. Pupils are equal, round,  and reactive to light. Right eye exhibits no discharge. Left eye exhibits no discharge. No scleral icterus.  Neck: Normal range of motion. Neck supple. No JVD present. No tracheal deviation present. No thyromegaly present.  Cardiovascular: Normal rate, regular rhythm, normal heart sounds and intact distal pulses.  Exam reveals no gallop and no friction rub.   No murmur heard. Pulmonary/Chest: Effort normal. No respiratory distress. She has no wheezes. She has rales. She exhibits no tenderness.  Crackles +  Abdominal: Soft. Bowel sounds are normal. She exhibits no distension and no mass. There is no tenderness. There is no rebound and no guarding.  Musculoskeletal: Normal range of motion. She exhibits no edema and no tenderness.  Lymphadenopathy:    She has no cervical adenopathy.  Neurological: She is alert and oriented to person, place, and time. She has normal reflexes. No cranial nerve deficit. She exhibits normal muscle tone. Coordination normal.  Skin: Skin is warm and dry. No rash noted. She is not diaphoretic. No erythema. No pallor.  Psychiatric: She has a normal mood and affect. Her behavior is normal. Judgment and thought content normal.   Filed Vitals:   01/06/14 1437  BP: 122/86  Pulse: 84  Height: $Remove'5\' 3"'[id number]$  (1.6 m)  Weight: 154 lb (69.854 kg)  SpO2: 90%          Assessment & Plan:  Do full PFT breathing test (do asap) Do blood test: ESR, ANA, DS-DNA, RF, anti-CCP, ssA, ssB, scl-70, ANCA, Total CK,  Aldolase,  And ACE level,  I will try to touch base with your liver doctor about safety of new pulmonary fibrosis medications  Followup  - return to see me after blood test and breathing test

## 2014-01-06 NOTE — Patient Instructions (Addendum)
Do full PFT breathing test (do asap) Do blood test: ESR, ANA, DS-DNA, RF, anti-CCP, ssA, ssB, scl-70, ANCA, Total CK,  Aldolase,  And ACE level,  I will try to touch base with your liver doctor about safety of new pulmonary fibrosis medications  Followup  - return to see me after blood test and breathing test

## 2014-01-07 ENCOUNTER — Ambulatory Visit: Payer: Medicare Other

## 2014-01-07 ENCOUNTER — Other Ambulatory Visit: Payer: Self-pay | Admitting: Gynecology

## 2014-01-07 LAB — SJOGREN'S SYNDROME ANTIBODS(SSA + SSB)
SSA (Ro) (ENA) Antibody, IgG: <1
SSB (La) (ENA) Antibody, IgG: <1

## 2014-01-07 LAB — ANTI-DNA ANTIBODY, DOUBLE-STRANDED

## 2014-01-07 LAB — ANTI-SCLERODERMA ANTIBODY: Scleroderma (Scl-70) (ENA) Antibody, IgG: <1

## 2014-01-07 LAB — CK TOTAL AND CKMB (NOT AT ARMC): CK TOTAL: 98 U/L (ref 7–177)

## 2014-01-07 LAB — ANA: Anti Nuclear Antibody(ANA): NEGATIVE

## 2014-01-07 LAB — RHEUMATOID FACTOR: Rhuematoid fact SerPl-aCnc: 21 IU/mL — ABNORMAL HIGH (ref <=14)

## 2014-01-07 LAB — CYCLIC CITRUL PEPTIDE ANTIBODY, IGG
CYCLIC CITRULLIN PEPTIDE AB: 3.1 U/mL (ref 0.0–5.0)
CYCLIC CITRULLIN PEPTIDE AB: 3.7 U/mL (ref 0.0–5.0)

## 2014-01-07 LAB — ANGIOTENSIN CONVERTING ENZYME: Angiotensin-Converting Enzyme: 68 U/L — ABNORMAL HIGH (ref 8–52)

## 2014-01-08 ENCOUNTER — Ambulatory Visit
Admission: RE | Admit: 2014-01-08 | Discharge: 2014-01-08 | Disposition: A | Discharge status: Home / Self Care | Payer: Medicare Other | Source: Ambulatory Visit

## 2014-01-08 DIAGNOSIS — Z1231 Encounter for screening mammogram for malignant neoplasm of breast: Secondary | ICD-10-CM

## 2014-01-09 ENCOUNTER — Telehealth: Payer: Self-pay | Admitting: Internal Medicine

## 2014-01-09 ENCOUNTER — Telehealth: Payer: Self-pay | Admitting: Medical

## 2014-01-09 LAB — ALDOLASE: Aldolase: 3.9 U/L (ref <=8.1)

## 2014-01-09 NOTE — Telephone Encounter (Signed)
Please tell BRADI ARBUTHNOT that I need to talk to her liver specialist. I need contact information: name and number. Please send that info to me  I will discuss result of pft, blood labs etc., at fu visit  Dr. Brand Males, M.D., [facility].C.P Pulmonary and Critical Care Medicine Staff Physician [place] Pulmonary and Critical Care Pager: [phone], If no answer or between  15:00h - 7:00h: call [phone]  01/09/2014 11:10 AM

## 2014-01-14 NOTE — Telephone Encounter (Signed)
Pt returned call

## 2014-01-14 NOTE — Telephone Encounter (Signed)
She can return for nurse visit fasting labs only, the labs are and there as future orders from April 15 order  Please get more information on what type of letter she needs, and may need to see her in the office for this but let's find out what exactly she needs (time frame the person is going to stay with her, why do they need a letter, is this an FMLA issue, etc.).

## 2014-01-14 NOTE — Telephone Encounter (Signed)
[email or web address].Margaret Adkins, CMA  

## 2014-01-14 NOTE — Telephone Encounter (Signed)
Spoke to NP liver specialist Dawn D   - harvoni will complete Hep C rx 03/27/14.   - patient does not hve cirrhosis but has early fibrosis which can be progressive but they are planning to prevent getting worse with Harvoni Rx  My plan is to start esbriet/ofev after March 27, 2014  I will discuss these at fu with her  Dr. Brand Males, M.D., [facility].C.P Pulmonary and Critical Care Medicine Staff Physician [place] [facility]monary and Critical Care Pager: [phone], If no answer or between  15:00h - 7:00h: call [phone]  01/14/2014 3:25 PM

## 2014-01-14 NOTE — Telephone Encounter (Signed)
Patient states that her daughter works for [employer] and she wants to request time off from her job to come visit and stay with her mom for a while. She would like a letter stating the facts about her condition in detail so she can fill out the FMLA forms but she needs to know what is her mother's condition. CLS  Patient is aware to follow up here about her labs. CLS

## 2014-01-14 NOTE — Telephone Encounter (Signed)
Spoke with pt. Aware results will be discuss at [place] Her liver specialists name is Dr. Roosevelt Locks. Phone # [phone]. Please advise thanks

## 2014-01-15 ENCOUNTER — Encounter: Payer: Self-pay | Admitting: Medical

## 2014-01-15 NOTE — Telephone Encounter (Signed)
Send letter

## 2014-01-15 NOTE — Telephone Encounter (Signed)
I mailed the patient the letter. CLS

## 2014-01-27 ENCOUNTER — Telehealth: Payer: Self-pay | Admitting: Internal Medicine

## 2014-01-27 NOTE — Assessment & Plan Note (Signed)
Likely IPF based on age, C chest fndings of UIP, < 4 year since dx, and progressive course. However, given female sex will need to rule out autoimmune disease. IF negative, then IPF prob very very high  Regarding Rx: she has advance disease with FVC < 50% and DLCO < 30% as of dec 2013. DAta on utility at this severity is sparse and she will have to weigh risk if side effectsv beneft (NNT 1 to 6 in reducing progression)  Additionally, from liver stand point there might not be a contra-indication for Esbriet/Ofev due to lack of child c cirrhosis but probably safeer we wait the 120 days till she completes HARVONI for Hep C. I would hedge that Esbriet might be a better choice  (if she can take 9 pills a day without getting confused) due to animal evidence of benefit in non-pulmonary fibrosis (She has some on her liver)  Will see her afer testing  Thanks for opportunity

## 2014-01-27 NOTE — Telephone Encounter (Signed)
sheis having PFT 02/05/14. ENsre fu after that. I have opened some new office spots  Dr. Brand Males, M.D., [facility].C.P Pulmonary and Critical Care Medicine Staff Physician [place] [facility] and Critical [facility] Pager: [phone], If no answer or between  15:00h - 7:00h: call [phone]  01/27/2014 3:25 PM

## 2014-01-28 ENCOUNTER — Other Ambulatory Visit: Payer: Self-pay | Admitting: Family Medicine

## 2014-01-28 NOTE — Telephone Encounter (Signed)
Called and spoke to pt regarding f/u after pft. Apt was made for 02/12/2014 at 3:15pm, pt confirmed apt date and time. Pt verbalized understanding and denied any further questions or concerns at this time.

## 2014-02-05 ENCOUNTER — Ambulatory Visit (INDEPENDENT_AMBULATORY_CARE_PROVIDER_SITE_OTHER): Payer: Medicare Other | Admitting: Internal Medicine

## 2014-02-05 DIAGNOSIS — J8410 Pulmonary fibrosis, unspecified: Secondary | ICD-10-CM

## 2014-02-05 DIAGNOSIS — J849 Interstitial pulmonary disease, unspecified: Secondary | ICD-10-CM

## 2014-02-05 LAB — PULMONARY FUNCTION TEST
DL/VA % pred: 78 %
DL/VA: 3.59 ml/min/mmHg/L
DLCO unc % pred: 19 %
DLCO unc: 4.31 ml/min/mmHg
FEF 25-75 Post: 1.27 L/sec
FEF 25-75 Pre: 0.82 L/sec
FEF2575-%CHANGE-POST: 53 %
FEF2575-%Pred-Post: 87 %
FEF2575-%Pred-Pre: 56 %
FEV1-%CHANGE-POST: 9 %
FEV1-%PRED-POST: 33 %
FEV1-%Pred-Pre: 30 %
FEV1-POST: 0.54 L
FEV1-Pre: 0.49 L
FEV1FVC-%CHANGE-POST: 0 %
FEV1FVC-%PRED-PRE: 126 %
FEV6-%Change-Post: 10 %
FEV6-%PRED-PRE: 25 %
FEV6-%Pred-Post: 28 %
FEV6-Post: 0.56 L
FEV6-Pre: 0.51 L
FEV6FVC-%PRED-POST: 104 %
FEV6FVC-%PRED-PRE: 104 %
FVC-%CHANGE-POST: 10 %
FVC-%Pred-Post: 27 %
FVC-%Pred-Pre: 24 %
FVC-Post: 0.56 L
FVC-Pre: 0.51 L
PRE FEV1/FVC RATIO: 97 %
PRE FEV6/FVC RATIO: 100 %
Post FEV1/FVC ratio: 96 %
Post FEV6/FVC ratio: 100 %
RV % PRED: 37 %
RV: 0.82 L
TLC % pred: 28 %
TLC: 1.35 L

## 2014-02-05 NOTE — Progress Notes (Signed)
PFT done today. 

## 2014-02-11 ENCOUNTER — Other Ambulatory Visit: Payer: Medicare Other

## 2014-02-11 ENCOUNTER — Ambulatory Visit (INDEPENDENT_AMBULATORY_CARE_PROVIDER_SITE_OTHER): Payer: Medicare Other | Admitting: Medical

## 2014-02-11 ENCOUNTER — Encounter: Payer: Self-pay | Admitting: Medical

## 2014-02-11 VITALS — BP 120/80 | HR 68 | Temp 97.6°F | Resp 32

## 2014-02-11 DIAGNOSIS — I2510 Atherosclerotic heart disease of native coronary artery without angina pectoris: Secondary | ICD-10-CM

## 2014-02-11 DIAGNOSIS — J8410 Pulmonary fibrosis, unspecified: Secondary | ICD-10-CM

## 2014-02-11 DIAGNOSIS — J849 Interstitial pulmonary disease, unspecified: Secondary | ICD-10-CM

## 2014-02-11 DIAGNOSIS — R0902 Hypoxemia: Secondary | ICD-10-CM

## 2014-02-11 LAB — HEMOGLOBIN A1C
Hgb A1c MFr Bld: 6.1 % — ABNORMAL HIGH (ref <5.7)
MEAN PLASMA GLUCOSE: 128 mg/dL — AB (ref <117)

## 2014-02-11 NOTE — Progress Notes (Signed)
Subjective:  Margaret Adkins is a 73 y.o. female who presents for cough spells, not breathing well.   Was originally here for lab visit only, but had bad coughing spell, nurse pulled her in room and pulse ox was down in the 75% range.   She is on portable oxygen 4L/min.  Her sister brought her in today.   She has appt with Dr. Lavonna Rua tomorrow for routine f/u  She is currently on hepatitis C medication.  She notes a few weeks of coughing spells, feels some congestion with some yellow mucous production, some sinus pressure.   She is using Symbicort BID, albuterol q6 hours in general.  No fever, NVD, no urinary or stool complaints, no ear pain.  No sick contacts. No other aggravating or relieving factors.  No other c/o.  The following portions of the patient's history were reviewed and updated as appropriate: allergies, current medications, past family history, past medical history, past social history, past surgical history and problem list.  ROS as in subjective   Objective: BP 132/80  Pulse 68  Temp(Src) 97.6 F (36.4 C) (Tympanic)  Resp 32  SpO2 81% '  General appearance: Alert, WD/WN, initially tachypnea, but after several minutes at rest improved                             Skin: warm, no rash, no diaphoresis                           Head: no sinus tenderness                            Eyes: conjunctiva normal, corneas clear, PERRLA                            Ears: pearly TMs, external ear canals normal                          Nose: septum midline, turbinates swollen, with no erythema or discharge             Mouth/throat: MMM, tongue normal, no pharyngeal erythema                           Neck: supple, no adenopathy, no thyromegaly, nontender                          Heart: RRR, normal S1, S2, no murmurs                         Lungs: decreased breath sounds, no rhonchi, no wheezes, no rales                Extremities: no edema, nontender     Assessment: Encounter  Diagnoses  Name Primary?  . Interstitial lung disease Yes  . Hypoxemia       Plan:  Medication orders today include: We put her on 5L/min oxygen in house for about 5 minutes, then backed down to her usual of 4L/min.   After resting and having a chance to discuss her concern, she seems to be back at her baseline.   Pulse ox in the low 90% range.   Advised she  f/u as planned with pulmonology tomorrow.    At time of discharge pulse ox 98% on 4L/min, respirations 18/min.  Discharged home.

## 2014-02-11 NOTE — Patient Instructions (Signed)
  Thank you for giving me the opportunity to serve you today.    Your diagnosis today includes: Encounter Diagnoses  Name Primary?  . Interstitial lung disease Yes  . Hypoxemia      Specific recommendations today include:  Ask your lung doctor tomorrow what he recommends to have on hand for worse mucous /congestion, cough suppression  Continue 4L/min oxygen, continue Sybmicort, Albuterol inhaler as needed  If needed, can use some plain Mucinex for congestion, Delsym plain for cough  Return f/u with pulm tomorrow.

## 2014-02-12 ENCOUNTER — Encounter: Payer: Self-pay | Admitting: Medical

## 2014-02-12 ENCOUNTER — Encounter: Payer: Self-pay | Admitting: Internal Medicine

## 2014-02-12 ENCOUNTER — Ambulatory Visit (INDEPENDENT_AMBULATORY_CARE_PROVIDER_SITE_OTHER): Payer: Medicare Other | Admitting: Internal Medicine

## 2014-02-12 VITALS — BP 130/82 | HR 89 | Ht 63.0 in | Wt 151.2 lb

## 2014-02-12 DIAGNOSIS — I2510 Atherosclerotic heart disease of native coronary artery without angina pectoris: Secondary | ICD-10-CM

## 2014-02-12 DIAGNOSIS — J209 Acute bronchitis, unspecified: Secondary | ICD-10-CM

## 2014-02-12 DIAGNOSIS — J84112 Idiopathic pulmonary fibrosis: Secondary | ICD-10-CM

## 2014-02-12 DIAGNOSIS — J9610 Chronic respiratory failure, unspecified whether with hypoxia or hypercapnia: Secondary | ICD-10-CM

## 2014-02-12 LAB — BASIC METABOLIC PANEL
BUN: 16 mg/dL (ref 6–23)
CHLORIDE: 97 meq/L (ref 96–112)
CO2: 29 mEq/L (ref 19–32)
Calcium: 9.1 mg/dL (ref 8.4–10.5)
Creat: 1 mg/dL (ref 0.50–1.10)
Glucose, Bld: 93 mg/dL (ref 70–99)
Potassium: 4.3 mEq/L (ref 3.5–5.3)
SODIUM: 139 meq/L (ref 135–145)

## 2014-02-12 LAB — MICROALBUMIN / CREATININE URINE RATIO
Creatinine, Urine: 66.5 mg/dL
Microalb Creat Ratio: 7.5 mg/g (ref 0.0–30.0)
Microalb, Ur: 0.5 mg/dL (ref 0.00–1.89)

## 2014-02-12 LAB — HEPATIC FUNCTION PANEL
ALT: 9 U/L (ref 0–35)
AST: 17 U/L (ref 0–37)
Albumin: 4.3 g/dL (ref 3.5–5.2)
Alkaline Phosphatase: 48 U/L (ref 39–117)
BILIRUBIN DIRECT: 0.1 mg/dL (ref 0.0–0.3)
BILIRUBIN INDIRECT: 0.3 mg/dL (ref 0.2–1.2)
Total Bilirubin: 0.4 mg/dL (ref 0.2–1.2)
Total Protein: 7.2 g/dL (ref 6.0–8.3)

## 2014-02-12 MED ORDER — HYDROCODONE-HOMATROPINE 5-1.5 MG/5ML PO SYRP: 5.0000 mL | ORAL_SOLUTION | Freq: Two times a day (BID) | ORAL | Status: AC | PRN

## 2014-02-12 MED ORDER — ALBUTEROL SULFATE HFA 108 (90 BASE) MCG/ACT IN AERS: 2.0000 | INHALATION_SPRAY | Freq: Four times a day (QID) | RESPIRATORY_TRACT | Status: DC | PRN

## 2014-02-12 NOTE — Progress Notes (Signed)
Subjective:    Patient ID: Margaret Adkins, female    DOB: 04-14-1941, 73 y.o.   MRN: [id number]  HPI   10/04/12- 75 yoF former smoker referred by Margaret Ogle, PA. She describes shortness of breath x 6 months with no prior respiratory complaint. Pain left posterior lateral ribs for about the same interval, related to deep breath and twisting. Chronic cough with scant thick white sputum. Occasional night sweats without weight change or adenopathy. No blood or fever and nothing purulent. Has been anemic. History of hepatitis C. She has Ventolin and Symbicort used unevenly. She is unclear that they help. PFT 08/22/12- severe restriction, mild obstruction with resp to BD, DLCo severely reduced. FEV1 0.91/48%, FVC 1.12/ 41%, FEV1/FVC 0.81, FEF 25-75% 1.01/ 46%, TLC 46%, DLCO 28% CXR 11/17/11 IMPRESSION:  1. No acute cardia no pulmonary abnormalities.  2. Chronic interstitial changes, similar to previous exam.  Original Report Authenticated By: Margaret Adkins, M.D. MR chest 09/24/10 IMPRESSION:  1. 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 Although possibly from severe degenerative  arthropathy, the edema in the joint and adjacent osseous structures  raises the possibility of septic joint and osteomyelitis. No extra-  articular fluid collection is identified to provide greater  positive predictive value for septic joint. Correlate with  chronicity in onset of symptoms as well as other signs of infection  in determining the need for treatment or attempt at arthrocentesis.  2. Suspected atypical infectious bronchiolitis in the right lung  apex.  Provider: Weyman Rodney CT chest 07/20/10 IMPRESSION: Multifocal areas of peribronchial opacities involving  the upper lobes along with diffuse bronchial wall thickening is  most compatible with bronchopneumonia.  Provider: Elizabeth Adkins  11/06/12- 71 yoF former smoker referred by Margaret Ogle, PA. FOLLOWS FOR: has not had 6MW test done; Increased SOB-getting worse. Admits she has been struggling along with shortness of breath with exertion probably several months. An intermittent, somewhat pleuritic pain, left lateral chest is not new but is increased when she coughs. Notices frequent coughing spells with thick white sputum. Denies fever, blood, adenopathy, swelling, anginal pain or palpitation. PFT: 08/22/2012 severe restriction of total lung capacity, mild obstructive airways disease in small airways with response to bronchodilator, diffusion severely reduced. FVC 1.12/41%, FEV1 0.91/48%, FEV1/FVC 0.81, FEF 25-75% 1.01/46%. TLC 46%, DLCO 28%. CT chest 10/17/12 IMPRESSION:  1. Chronic interstitial changes of scarring, reticulation, and  cylindrical type bronchiectasis noted. Although there is no zonal  predominance or evidence of frank honeycombing these findings are  suspicious for early usual interstitial pneumonitis.  Original Report Authenticated By: Margaret Adkins, M.D. L Rib Xray 09/03/12- NEG ANA neg, Sed rate 15  12/13/12-71 yoF former smoker followed for Interstitial lung disease/ fibrosis/ bronchiectasis.   PCP Margaret Ogle, PA. FOLLOWS FOR: review ONO and 6MW test results with patient.  Pt states breathing is better since last OV. Pt would like to have Rx for Tussionex. Still coughing. Denies seasonal allergy. Sputum cultures from 11/06/2012 all negative so far with normal flora. Overnight oximetry 11/07/2012- adequate, not qualify for O2 during sleep. 6MWT- 12/13/12- 94%, 79%, 96%, 447 m. Desaturated during exertion. Good recovery.  06/13/13- 29 yoF former smoker followed for Interstitial lung disease/ fibrosis/bronchiectasis, complicated by hepatitis C.   PCP Margaret Ogle, PA FOLLOWS FOR: chest tightness, increased wheezing; needs surgery clearance for cataract surgery(anticipated date is  06-25-13) Gradually more dyspnea with exertion stops her every couple  of rooms. Still on home oxygen prescribed from [facility] clinic for sleep and exertion when she was there this summer. They diagnosed pulmonary fibrosis with no biopsy. Cough productive thick white sputum, no fever, blood or chest pain. Tussionex not much help for her cough. We reviewed images on disc from [facility] clinic  11/06/13- 72 yoF former smoker followed for Interstitial lung disease/ fibrosis/bronchiectasis, complicated by hepatitis C.   PCP Margaret Ogle, PA FOLLOWS FOR: continues to have increased SOB even with O2 on.  CT reported in dc summary from [facility] 04/21/13 had referred to progression of pulmonary fibrosis, unspecified, negative for PE.  ANA and Sed rate Neg. WNL 09/2012. Has been w family in [place] through winter and Rx'd there for pneumonia outpt. Continues O2 2L/ [employer].  C/O persistent pain L post lat ribs x 3-4 months, worse w deep breath, never gone. Rx'd Aleve. No fever. Cough prod thick white. No palpitation, leg pain, ankle edema, nodes or masses.   12/13/13- 18 yoF former smoker followed for Interstitial lung disease/ UIP/ bronchiectasis, complicated by hepatitis C.   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 Discharged on prednisone 40 mg daily. Still coughing, productive clear mucus. CT c/w UIP- images reviewed w/ her.  Now has O2 4l/ [employer]. Asks portable concentrator. Going oot to funeral for brother (lung Ca). Hx Hep C with normal recent liver and renal function. We had made referral appointment to discuss management of UIP with Dr Chase Caller- pending. CT chest 11/19/13 IMPRESSION:  1. 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  2. Atherosclerosis, including 2 vessel coronary artery disease.  Assessment for potential  risk factor modification, dietary therapy  or pharmacologic therapy may be warranted, if clinically indicated.  Electronically Signed  By: Margaret Adkins M.D.  On: 11/19/2013 15:37    OV 01/06/2014 -  ILD CONSULT with DR [facility]  Chief Complaint  Patient presents with  . Follow-up    Increased sob and cough non-productive x1 month    Referred by Dr. Tarri Fuller young for second opinion on pulmonary fibrosis specifically with question of starting IPF specific therapy  73 year old female ex-smoker without any exposure history. According to her history she was diagnosed with pulmonary fibrosis one year ago but review of the charts shows severe restrictive lung disease with reduced diffusion capacity even as far back as December 2013 with an FVC of 1.1 L/41%. FEV1 of 0.9 L or 48%. Ratio 79. Total lung capacity of 4.1/41%. Diffusion capacity  a 5.5/28%. CT scans of the chest reported by our radiologist here consistent with a diagnosis of UIP radiologically. She denies any collagen-vascular history diagnosis. However, notably that she's had ANA which is negative. She believes that she's been on oxygen for just over a year initially starting off at 2 L but has had progressive decline in functional status with worsening dyspnea and is currently on 4 L oxygen. A year ago she was visiting [place], [place] and it sounds like she had a flare up of her interstitial lung disease and she was admitted. 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 Conversation with Dr. Annamaria Boots in she's been recommended to see me for decision of starting therapy either Pirfenidone or OFEV.  She does have a history of hepatitis C and is currently on  new protease inhibitors; HORVANI. She showed me ultrasound reported social she is mild fibrosis of the liver. This was done at [facility] liver clinic. Subsequent to  The visit: I reviewed the drug  and have learnt that this is NOT a Cyp enzyme inhibitor/promoter that can alter Esbriet/OFev levels. Additionally, HORVANI is only a 120 intent to cure Rx. I spoke to her liver specialist and she asssures me that patient does NOT have cirrhosis which would be a contra-indication for IPF specific Rx   Lab review shows a creatinine of 0.8 mg percent with GFR of 81. Normal liver function enzymes with an albumin of 3.2 on 12/03/2013     has a past medical history of Colon polyps; Hepatitis C; Diverticulosis; Diabetes mellitus; Hypertension; GERD (gastroesophageal reflux disease); Osteoporosis; and Hyperlipidemia.   has past surgical history that includes Cholecystectomy (2004); Rotator cuff repair; Abdominal hysterectomy; lipoma removal; Breast biopsy; and Hemicolectomy.    OV 02/12/2014  Chief Complaint  Patient presents with  . Follow-up    With PFT results.Pt states she has been very dyspneic with any activity and having palpitations. C/o productive cough with off-white mucous. Denies CP.   autoimmes 01/06/14 - negatifvve CT march 2015 - definitive UIP per thoracic radiology [facility] - may 2015: negative Pulmonary function test in April 2015 with an FVC of 0.5 L/24%. Total lung capacity of 1.3 the/28%. DLCO 4.3 the/19%. This is consistent with severe restrictive lung disease  Dx is severe IPF with chronic respiratory failure  No new issues since last visit.. I gave a diagnosis of severe IPF and prognosis of few years to several years max. She is very tearful. We discussed treatment options with the new drugs that and to prevent the progression of IPF. Explained uncertainty with efficacy of these drugs with severe disease and possible insurance denial with these. Also explained side effects profile of these drugs.Of note, her Hep C Rx is going real well and titers not detectable. HORVANI Rx will end in Aug 2015. There is no known contraindication to esbriet or ofev with HORVANI. Based on all  that she's not sure that these drugs would be of benefit to her. She wants to think about this. I think esbriet would be a better choice for her if she decided to go ahead with Rx due to animal experiments with potential benefit in hepatic fibrosis.  I did discuss about enhancing her quality of life to a hospice program but at this point in time she's not totally in favor of that partly because she  struggling to come to terms with it. However she is receptive to the idea.  Past and Social history: She lives alone. One child lives in [place] the other one in [place]. The daughter in [place] wants her to move to [place] but she does not want to leave. She has no family members here other than some grandkids who are young and dry keep in touch with the.     Review of Systems  Constitutional: Negative for fever and unexpected weight change.  HENT: Negative for congestion, dental problem, ear pain, nosebleeds, postnasal drip, rhinorrhea, sinus pressure, sneezing, sore throat and trouble swallowing.   Eyes: Negative for redness and itching.  Respiratory: Positive for cough and shortness of breath. Negative for chest tightness and wheezing.   Cardiovascular: Positive for palpitations. Negative for leg swelling.  Gastrointestinal: Negative for nausea and vomiting.  Genitourinary: Negative for dysuria.  Musculoskeletal: Negative for joint swelling.  Skin:  Negative for rash.  Neurological: Negative for headaches.  Hematological: Does not bruise/bleed easily.  Psychiatric/Behavioral: Negative for dysphoric mood. The patient is not nervous/anxious.        Objective:   Physical Exam   Filed Vitals:   02/12/14 1520 02/12/14 1525  BP:  130/82  Pulse:  89  Height:  5\' 3"  (1.6 m)  Weight:  151 lb 3.2 oz (68.584 kg)  SpO2: 86% 98%   Discussion only visit     Assessment & Plan:  You have IPF disease THis is a type of Pulmonary Fibrosis This is the most common type of pulmonary fibrosis IT  is a progressive disease with life expectancy of few to several years  We discussed treatment options of drugs ESBRIET and OFEV We agreed that due to benefit v side effect profile you will want to think about it We discussed hospice benefit: we agreed you will think about this  COntinue O2 for now Try hycodan cough syrup 26mL twice daily as needed  Can consider moprhine syrup at followup   Followup  1 month - 2 months to see me and discuss your thoughts on this Will update Dr Annamaria Boots - patient would like to see me at next visit to continue discussions

## 2014-02-12 NOTE — Patient Instructions (Addendum)
   You have IPF disease THis is a type of Pulmonary Fibrosis This is the most common type of pulmonary fibrosis IT is a progressive disease with life expectancy of few to several years  We discussed treatment options of drugs ESBRIET and OFEV We agreed that due to benefit v side effect profile you will want to think about it We discussed hospice benefit: we agreed you will think about this  COntinue O2 for now Try hycodan cough syrup 46mL twice daily as needed  Can consider moprhine syrup at followup   Followup  1 month - 2 months to see me and discuss your thoughts on this Will update Dr Annamaria Boots - patient would like to see me at next visit to continue discussions

## 2014-02-13 ENCOUNTER — Other Ambulatory Visit: Payer: Self-pay

## 2014-02-13 MED ORDER — GLUCOSE BLOOD VI STRP: ORAL_STRIP | Status: DC

## 2014-02-14 ENCOUNTER — Telehealth: Payer: Self-pay | Admitting: Internal Medicine

## 2014-02-14 DIAGNOSIS — J9610 Chronic respiratory failure, unspecified whether with hypoxia or hypercapnia: Secondary | ICD-10-CM | POA: Insufficient documentation

## 2014-02-14 DIAGNOSIS — J84112 Idiopathic pulmonary fibrosis: Secondary | ICD-10-CM | POA: Insufficient documentation

## 2014-02-14 MED ORDER — GLUCOSE BLOOD VI STRP: ORAL_STRIP | Status: DC

## 2014-02-14 NOTE — Assessment & Plan Note (Signed)
I gave a diagnosis of severe IPF and prognosis of few years to several years max. She is very tearful. We discussed treatment options with the new drugs that and to prevent the progression of IPF. Explained uncertainty with efficacy of these drugs with severe disease and possible insurance denial with these. Also explained side effects profile of these drugs.. based on all that she's not sure that these drugs would be of benefit to her. She wants to think about this. I did discuss about enhancing her quality of life to a hospice program but at this point in time she's not totally in favor of that partly because she  struggling to come to terms with it. However she is receptive to the idea    You have IPF disease THis is a type of Pulmonary Fibrosis This is the most common type of pulmonary fibrosis IT is a progressive disease with life expectancy of few to several years  We discussed treatment options of drugs ESBRIET and OFEV We agreed that due to benefit v side effect profile you will want to think about it We discussed hospice benefit: we agreed you will think about this  COntinue O2 for now Try hycodan cough syrup 45mL twice daily as needed  Can consider moprhine syrup at followup   Followup  1 month - 2 months to see me and discuss your thoughts on this Will update Dr Annamaria Boots - patient would like to see me at next visit to continue discussions   > 50% of this > 25 min visit spent in face to face counseling (15 min visit converted to 25 min)

## 2014-02-14 NOTE — Telephone Encounter (Signed)
Pharmacy would not fill pt test strips because they did not have the meter name. Sent the correction to the pharmacy

## 2014-02-26 ENCOUNTER — Telehealth: Payer: Self-pay | Admitting: Internal Medicine

## 2014-02-26 NOTE — Telephone Encounter (Signed)
Pt uses [facility] for her diabetic suppies. Pt tests once daily.

## 2014-03-11 ENCOUNTER — Ambulatory Visit: Payer: Medicare Other | Admitting: Medical

## 2014-03-12 ENCOUNTER — Encounter: Payer: Self-pay | Admitting: Medical

## 2014-03-12 ENCOUNTER — Ambulatory Visit (INDEPENDENT_AMBULATORY_CARE_PROVIDER_SITE_OTHER): Payer: Medicare Other | Admitting: Medical

## 2014-03-12 VITALS — BP 128/68 | HR 78 | Temp 97.7°F | Resp 18 | Wt 155.0 lb

## 2014-03-12 DIAGNOSIS — R109 Unspecified abdominal pain: Secondary | ICD-10-CM

## 2014-03-12 DIAGNOSIS — R300 Dysuria: Secondary | ICD-10-CM

## 2014-03-12 DIAGNOSIS — I2510 Atherosclerotic heart disease of native coronary artery without angina pectoris: Secondary | ICD-10-CM

## 2014-03-12 DIAGNOSIS — K439 Ventral hernia without obstruction or gangrene: Secondary | ICD-10-CM

## 2014-03-12 LAB — POCT URINALYSIS DIPSTICK
Bilirubin, UA: NEGATIVE
Glucose, UA: NEGATIVE
KETONES UA: NEGATIVE
Nitrite, UA: NEGATIVE
PH UA: 7
Protein, UA: NEGATIVE
RBC UA: NEGATIVE
Spec Grav, UA: <=1.005
Urobilinogen, UA: NEGATIVE

## 2014-03-12 MED ORDER — MICONAZOLE NITRATE 2 % VA CREA: 1.0000 | TOPICAL_CREAM | Freq: Every day | VAGINAL | Status: DC

## 2014-03-12 NOTE — Progress Notes (Signed)
Subjective:  Margaret Adkins is a 73 y.o. female who complains of possible urinary tract infection.  She has had symptoms for 3 days.  Symptoms include vaginal burning, itching, burning with urination. Patient denies back pain and fever.  Denies vaginal discharge.  Can't recall last yeast infection.  Last UTI was unsure?   Using nothing for current symptoms.    She continues to c/o "hernia" on the belly along the prior gynecology surgical scar.  She coughs often, and says the hernia bulges out and is painful with coughing.   No other aggravating or relieving factors.  No other c/o.  ROS as in subjective  Reviewed allergies, medications, past medical, surgical, and social history.    Objective: Filed Vitals:   03/12/14 1132  BP: 128/68  Pulse: 78  Temp: 97.7 F (36.5 C)  Resp: 18    General appearance: alert, no distress, WD/WN, female seated with oxygen via nasal cannula Abdomen: +bs, soft, ventral surgical scar vertically,mild tenderness to palpation, non distended, no masses, no hepatomegaly, no splenomegaly, no bruits Back: no CVA tenderness GU: declined per pt.     Laboratory:  Urine dipstick:  Results for orders placed in visit on 03/12/14 (from the past 24 hour(s))  POCT URINALYSIS DIPSTICK     Status: None   Collection Time    03/12/14 11:43 AM      Result Value Ref Range   Color, UA yellow     Clarity, UA clear     Glucose, UA neg     Bilirubin, UA neg     Ketones, UA neg     Spec Grav, UA <=1.005     Blood, UA neg     pH, UA 7.0     Protein, UA neg     Urobilinogen, UA negative     Nitrite, UA neg     Leukocytes, UA Trace          Assessment: Encounter Diagnoses  Name Primary?  . Burning with urination Yes  . Ventral hernia without obstruction or gangrene   . Abdominal pain, unspecified site      Plan: Discussed symptoms.  Urine culture sent.  Will treat for yeast vaginitis given the symptoms and lack of urinary findings, but f/u pending urine  culture.   Discussed ventral hernia, CT results regarding this.  Script today for hernia belt.    See if this gives some comfort.   Call or return if worse or not improving.

## 2014-03-14 ENCOUNTER — Telehealth: Payer: Self-pay | Admitting: Family Medicine

## 2014-03-14 ENCOUNTER — Other Ambulatory Visit: Payer: Self-pay | Admitting: Medical

## 2014-03-14 LAB — URINE CULTURE: Colony Count: >=100000

## 2014-03-14 MED ORDER — CIPROFLOXACIN HCL 500 MG PO TABS: 500.0000 mg | ORAL_TABLET | Freq: Two times a day (BID) | ORAL | Status: DC

## 2014-03-14 NOTE — Telephone Encounter (Signed)
Patient called and wants to know if you could write a letter for her son just like you wrote for her daughter. She states he needs it for his job because he coming to stay with her as well. CLS

## 2014-03-17 ENCOUNTER — Other Ambulatory Visit: Payer: Self-pay | Admitting: Medical

## 2014-03-17 ENCOUNTER — Telehealth: Payer: Self-pay | Admitting: Medical

## 2014-03-17 MED ORDER — NITROFURANTOIN MONOHYD MACRO 100 MG PO CAPS: 100.0000 mg | ORAL_CAPSULE | Freq: Two times a day (BID) | ORAL | Status: DC

## 2014-03-17 NOTE — Telephone Encounter (Signed)
Antibiotic Macrobid sent.

## 2014-03-17 NOTE — Telephone Encounter (Signed)
Please call patient  Meds, ciprofloxacan, too strong, causing vomiting.    Margaret Adkins

## 2014-03-18 ENCOUNTER — Encounter: Payer: Self-pay | Admitting: Medical

## 2014-03-18 NOTE — Telephone Encounter (Signed)
Pt.notified

## 2014-03-18 NOTE — Telephone Encounter (Signed)
pls get the letter to her

## 2014-03-18 NOTE — Telephone Encounter (Signed)
Letter was printed and mailed to pt.

## 2014-04-03 ENCOUNTER — Emergency Department (HOSPITAL_COMMUNITY): Payer: Medicare Other

## 2014-04-03 ENCOUNTER — Inpatient Hospital Stay (HOSPITAL_COMMUNITY)
Admission: EM | Admit: 2014-04-03 | Discharge: 2014-04-07 | DRG: 286 | Disposition: A | Discharge status: Home / Self Care | Payer: Medicare Other | Attending: Cardiology | Admitting: Cardiology

## 2014-04-03 ENCOUNTER — Encounter (HOSPITAL_COMMUNITY): Payer: Self-pay | Admitting: Emergency Medicine

## 2014-04-03 DIAGNOSIS — E785 Hyperlipidemia, unspecified: Secondary | ICD-10-CM | POA: Diagnosis present

## 2014-04-03 DIAGNOSIS — J84112 Idiopathic pulmonary fibrosis: Secondary | ICD-10-CM | POA: Diagnosis present

## 2014-04-03 DIAGNOSIS — I248 Other forms of acute ischemic heart disease: Principal | ICD-10-CM | POA: Diagnosis present

## 2014-04-03 DIAGNOSIS — Z7982 Long term (current) use of aspirin: Secondary | ICD-10-CM | POA: Diagnosis not present

## 2014-04-03 DIAGNOSIS — I2489 Other forms of acute ischemic heart disease: Secondary | ICD-10-CM | POA: Diagnosis present

## 2014-04-03 DIAGNOSIS — J9611 Chronic respiratory failure with hypoxia: Secondary | ICD-10-CM

## 2014-04-03 DIAGNOSIS — J9610 Chronic respiratory failure, unspecified whether with hypoxia or hypercapnia: Secondary | ICD-10-CM | POA: Diagnosis present

## 2014-04-03 DIAGNOSIS — E7800 Pure hypercholesterolemia, unspecified: Secondary | ICD-10-CM | POA: Diagnosis present

## 2014-04-03 DIAGNOSIS — I214 Non-ST elevation (NSTEMI) myocardial infarction: Secondary | ICD-10-CM

## 2014-04-03 DIAGNOSIS — M810 Age-related osteoporosis without current pathological fracture: Secondary | ICD-10-CM | POA: Diagnosis present

## 2014-04-03 DIAGNOSIS — Z9981 Dependence on supplemental oxygen: Secondary | ICD-10-CM

## 2014-04-03 DIAGNOSIS — I079 Rheumatic tricuspid valve disease, unspecified: Secondary | ICD-10-CM | POA: Diagnosis present

## 2014-04-03 DIAGNOSIS — B1920 Unspecified viral hepatitis C without hepatic coma: Secondary | ICD-10-CM | POA: Diagnosis present

## 2014-04-03 DIAGNOSIS — I509 Heart failure, unspecified: Secondary | ICD-10-CM

## 2014-04-03 DIAGNOSIS — E119 Type 2 diabetes mellitus without complications: Secondary | ICD-10-CM

## 2014-04-03 DIAGNOSIS — J9620 Acute and chronic respiratory failure, unspecified whether with hypoxia or hypercapnia: Secondary | ICD-10-CM | POA: Diagnosis present

## 2014-04-03 DIAGNOSIS — R9431 Abnormal electrocardiogram [ECG] [EKG]: Secondary | ICD-10-CM

## 2014-04-03 DIAGNOSIS — I10 Essential (primary) hypertension: Secondary | ICD-10-CM

## 2014-04-03 DIAGNOSIS — K219 Gastro-esophageal reflux disease without esophagitis: Secondary | ICD-10-CM | POA: Diagnosis present

## 2014-04-03 DIAGNOSIS — R7989 Other specified abnormal findings of blood chemistry: Secondary | ICD-10-CM

## 2014-04-03 DIAGNOSIS — Z87891 Personal history of nicotine dependence: Secondary | ICD-10-CM | POA: Diagnosis not present

## 2014-04-03 DIAGNOSIS — R071 Chest pain on breathing: Secondary | ICD-10-CM

## 2014-04-03 DIAGNOSIS — R778 Other specified abnormalities of plasma proteins: Secondary | ICD-10-CM

## 2014-04-03 HISTORY — DX: Pulmonary fibrosis, unspecified: J84.10

## 2014-04-03 LAB — COMPREHENSIVE METABOLIC PANEL
ALK PHOS: 53 U/L (ref 39–117)
ALT: 11 U/L (ref 0–35)
AST: 23 U/L (ref 0–37)
Albumin: 3.8 g/dL (ref 3.5–5.2)
Anion gap: 14 (ref 5–15)
BUN: 13 mg/dL (ref 6–23)
CALCIUM: 9.5 mg/dL (ref 8.4–10.5)
CO2: 28 meq/L (ref 19–32)
Chloride: 99 mEq/L (ref 96–112)
Creatinine, Ser: 0.97 mg/dL (ref 0.50–1.10)
GFR calc Af Amer: 66 mL/min — ABNORMAL LOW (ref >90)
GFR, EST NON AFRICAN AMERICAN: 57 mL/min — AB (ref >90)
Glucose, Bld: 83 mg/dL (ref 70–99)
POTASSIUM: 4.1 meq/L (ref 3.7–5.3)
SODIUM: 141 meq/L (ref 137–147)
TOTAL PROTEIN: 7.3 g/dL (ref 6.0–8.3)
Total Bilirubin: 0.3 mg/dL (ref 0.3–1.2)

## 2014-04-03 LAB — I-STAT TROPONIN, ED
Troponin i, poc: 0.13 ng/mL (ref 0.00–0.08)
Troponin i, poc: 0.23 ng/mL (ref 0.00–0.08)

## 2014-04-03 LAB — CBC WITH DIFFERENTIAL/PLATELET
BASOS ABS: 0 10*3/uL (ref 0.0–0.1)
Basophils Relative: 0 % (ref 0–1)
EOS PCT: 3 % (ref 0–5)
Eosinophils Absolute: 0.2 10*3/uL (ref 0.0–0.7)
HCT: 32.1 % — ABNORMAL LOW (ref 36.0–46.0)
Hemoglobin: 10.3 g/dL — ABNORMAL LOW (ref 12.0–15.0)
Lymphocytes Relative: 28 % (ref 12–46)
Lymphs Abs: 1.9 10*3/uL (ref 0.7–4.0)
MCH: 30.5 pg (ref 26.0–34.0)
MCHC: 32.1 g/dL (ref 30.0–36.0)
MCV: 95 fL (ref 78.0–100.0)
Monocytes Absolute: 0.4 10*3/uL (ref 0.1–1.0)
Monocytes Relative: 6 % (ref 3–12)
Neutro Abs: 4.4 10*3/uL (ref 1.7–7.7)
Neutrophils Relative %: 63 % (ref 43–77)
PLATELETS: 164 10*3/uL (ref 150–400)
RBC: 3.38 MIL/uL — ABNORMAL LOW (ref 3.87–5.11)
RDW: 12.8 % (ref 11.5–15.5)
WBC: 6.9 10*3/uL (ref 4.0–10.5)

## 2014-04-03 LAB — TSH: TSH: 1.07 u[IU]/mL (ref 0.350–4.500)

## 2014-04-03 LAB — T4, FREE: Free T4: 1.26 ng/dL (ref 0.80–1.80)

## 2014-04-03 LAB — I-STAT CG4 LACTIC ACID, ED: Lactic Acid, Venous: 1.44 mmol/L (ref 0.5–2.2)

## 2014-04-03 LAB — PRO B NATRIURETIC PEPTIDE: PRO B NATRI PEPTIDE: 1449 pg/mL — AB (ref 0–125)

## 2014-04-03 LAB — MRSA PCR SCREENING: MRSA BY PCR: NEGATIVE

## 2014-04-03 LAB — GLUCOSE, CAPILLARY
GLUCOSE-CAPILLARY: 115 mg/dL — AB (ref 70–99)
Glucose-Capillary: 101 mg/dL — ABNORMAL HIGH (ref 70–99)

## 2014-04-03 LAB — TROPONIN I: TROPONIN I: 0.38 ng/mL — AB (ref <0.30)

## 2014-04-03 LAB — CBG MONITORING, ED: Glucose-Capillary: 92 mg/dL (ref 70–99)

## 2014-04-03 LAB — MAGNESIUM: Magnesium: 1.3 mg/dL — ABNORMAL LOW (ref 1.5–2.5)

## 2014-04-03 LAB — HEMOGLOBIN A1C
Hgb A1c MFr Bld: 6.1 % — ABNORMAL HIGH (ref <5.7)
MEAN PLASMA GLUCOSE: 128 mg/dL — AB (ref <117)

## 2014-04-03 LAB — HEPARIN LEVEL (UNFRACTIONATED): HEPARIN UNFRACTIONATED: 0.43 [IU]/mL (ref 0.30–0.70)

## 2014-04-03 MED ORDER — MAGNESIUM SULFATE 40 MG/ML IJ SOLN
2.0000 g | Freq: Once | INTRAMUSCULAR | Status: AC
  Administered 2014-04-03: 2 g via INTRAVENOUS
  Filled 2014-04-03: qty 50

## 2014-04-03 MED ORDER — NITROGLYCERIN 0.4 MG SL SUBL
0.4000 mg | SUBLINGUAL_TABLET | SUBLINGUAL | Status: DC | PRN
Start: 2014-04-03 — End: 2014-04-07

## 2014-04-03 MED ORDER — NITROGLYCERIN IN D5W 200-5 MCG/ML-% IV SOLN
3.0000 ug/min | INTRAVENOUS | Status: DC
  Administered 2014-04-03: 3 ug/min via INTRAVENOUS
  Filled 2014-04-03: qty 250

## 2014-04-03 MED ORDER — ASPIRIN 325 MG PO TABS
325.0000 mg | ORAL_TABLET | Freq: Once | ORAL | Status: AC
  Administered 2014-04-03: 325 mg via ORAL
  Filled 2014-04-03: qty 1

## 2014-04-03 MED ORDER — ATORVASTATIN CALCIUM 80 MG PO TABS
80.0000 mg | ORAL_TABLET | Freq: Every day | ORAL | Status: DC
  Administered 2014-04-03: 80 mg via ORAL
  Filled 2014-04-03 (×2): qty 1

## 2014-04-03 MED ORDER — DICLOFENAC SODIUM 1 % TD GEL: 4.0000 g | Freq: Every day | TRANSDERMAL | Status: DC | PRN

## 2014-04-03 MED ORDER — OLMESARTAN MEDOXOMIL 40 MG PO TABS
40.0000 mg | ORAL_TABLET | Freq: Every day | ORAL | Status: DC
  Administered 2014-04-04 – 2014-04-07 (×3): 40 mg via ORAL
  Filled 2014-04-03 (×4): qty 1

## 2014-04-03 MED ORDER — INSULIN ASPART 100 UNIT/ML ~~LOC~~ SOLN
0.0000 [IU] | Freq: Three times a day (TID) | SUBCUTANEOUS | Status: DC
  Administered 2014-04-04: 18:00:00 3 [IU] via SUBCUTANEOUS
  Administered 2014-04-07: 2 [IU] via SUBCUTANEOUS

## 2014-04-03 MED ORDER — ACETAMINOPHEN 325 MG PO TABS
650.0000 mg | ORAL_TABLET | ORAL | Status: DC | PRN
  Administered 2014-04-03: 650 mg via ORAL
  Filled 2014-04-03: qty 2

## 2014-04-03 MED ORDER — HYDROCHLOROTHIAZIDE 25 MG PO TABS
25.0000 mg | ORAL_TABLET | Freq: Every day | ORAL | Status: DC
  Administered 2014-04-04 – 2014-04-07 (×3): 25 mg via ORAL
  Filled 2014-04-03 (×4): qty 1

## 2014-04-03 MED ORDER — HEPARIN SODIUM (PORCINE) 1000 UNIT/ML IJ SOLN
4000.0000 [IU] | Freq: Once | INTRAMUSCULAR | Status: AC
  Administered 2014-04-03: 4000 [IU] via INTRAVENOUS
  Filled 2014-04-03 (×2): qty 4

## 2014-04-03 MED ORDER — FUROSEMIDE 10 MG/ML IJ SOLN
40.0000 mg | Freq: Two times a day (BID) | INTRAMUSCULAR | Status: DC
  Administered 2014-04-03: 40 mg via INTRAVENOUS
  Filled 2014-04-03: qty 4

## 2014-04-03 MED ORDER — ASPIRIN EC 81 MG PO TBEC
81.0000 mg | DELAYED_RELEASE_TABLET | Freq: Every day | ORAL | Status: DC
  Administered 2014-04-05 – 2014-04-07 (×3): 81 mg via ORAL
  Filled 2014-04-03 (×4): qty 1

## 2014-04-03 MED ORDER — HYDROCODONE-HOMATROPINE 5-1.5 MG/5ML PO SYRP
5.0000 mL | ORAL_SOLUTION | Freq: Two times a day (BID) | ORAL | Status: DC | PRN
  Administered 2014-04-04 – 2014-04-06 (×4): 5 mL via ORAL
  Filled 2014-04-03 (×4): qty 5

## 2014-04-03 MED ORDER — FUROSEMIDE 10 MG/ML IJ SOLN
40.0000 mg | Freq: Two times a day (BID) | INTRAMUSCULAR | Status: DC
  Administered 2014-04-03 – 2014-04-05 (×4): 40 mg via INTRAVENOUS
  Filled 2014-04-03 (×7): qty 4

## 2014-04-03 MED ORDER — ONDANSETRON HCL 4 MG/2ML IJ SOLN: 4.0000 mg | Freq: Four times a day (QID) | INTRAMUSCULAR | Status: DC | PRN

## 2014-04-03 MED ORDER — HEPARIN (PORCINE) IN NACL 100-0.45 UNIT/ML-% IJ SOLN
800.0000 [IU]/h | INTRAMUSCULAR | Status: DC
  Administered 2014-04-03: 800 [IU]/h via INTRAVENOUS
  Filled 2014-04-03 (×2): qty 250

## 2014-04-03 MED ORDER — TRAMADOL HCL 50 MG PO TABS
50.0000 mg | ORAL_TABLET | Freq: Four times a day (QID) | ORAL | Status: DC | PRN
  Administered 2014-04-04 – 2014-04-06 (×3): 50 mg via ORAL
  Filled 2014-04-03 (×3): qty 1

## 2014-04-03 MED ORDER — DIPHENHYDRAMINE HCL 25 MG PO CAPS: 25.0000 mg | ORAL_CAPSULE | Freq: Four times a day (QID) | ORAL | Status: DC | PRN

## 2014-04-03 MED ORDER — ASPIRIN 81 MG PO CHEW
324.0000 mg | CHEWABLE_TABLET | ORAL | Status: AC
Start: 2014-04-03 — End: 2014-04-04
  Administered 2014-04-04: 324 mg via ORAL
  Filled 2014-04-03: qty 4

## 2014-04-03 MED ORDER — TRAVOPROST (BAK FREE) 0.004 % OP SOLN
1.0000 [drp] | Freq: Every day | OPHTHALMIC | Status: DC
  Administered 2014-04-03 – 2014-04-06 (×4): 1 [drp] via OPHTHALMIC
  Filled 2014-04-03: qty 2.5

## 2014-04-03 MED ORDER — PANTOPRAZOLE SODIUM 40 MG PO TBEC
40.0000 mg | DELAYED_RELEASE_TABLET | Freq: Every day | ORAL | Status: DC
Start: 2014-04-03 — End: 2014-04-07
  Administered 2014-04-04 – 2014-04-07 (×4): 40 mg via ORAL
  Filled 2014-04-03 (×5): qty 1

## 2014-04-03 MED ORDER — METOPROLOL TARTRATE 12.5 MG HALF TABLET
12.5000 mg | ORAL_TABLET | Freq: Two times a day (BID) | ORAL | Status: DC
  Administered 2014-04-03 – 2014-04-07 (×8): 12.5 mg via ORAL
  Filled 2014-04-03 (×10): qty 1

## 2014-04-03 MED ORDER — IPRATROPIUM-ALBUTEROL 0.5-2.5 (3) MG/3ML IN SOLN
3.0000 mL | Freq: Once | RESPIRATORY_TRACT | Status: AC
  Administered 2014-04-03: 3 mL via RESPIRATORY_TRACT
  Filled 2014-04-03: qty 3

## 2014-04-03 MED ORDER — ALBUTEROL SULFATE (2.5 MG/3ML) 0.083% IN NEBU: 2.5000 mg | INHALATION_SOLUTION | Freq: Four times a day (QID) | RESPIRATORY_TRACT | Status: DC | PRN

## 2014-04-03 MED ORDER — BUDESONIDE-FORMOTEROL FUMARATE 160-4.5 MCG/ACT IN AERO
2.0000 | INHALATION_SPRAY | Freq: Two times a day (BID) | RESPIRATORY_TRACT | Status: DC
  Administered 2014-04-03 – 2014-04-07 (×7): 2 via RESPIRATORY_TRACT
  Filled 2014-04-03: qty 6

## 2014-04-03 MED ORDER — ALBUTEROL SULFATE HFA 108 (90 BASE) MCG/ACT IN AERS: 2.0000 | INHALATION_SPRAY | Freq: Four times a day (QID) | RESPIRATORY_TRACT | Status: DC | PRN

## 2014-04-03 MED ORDER — CYCLOBENZAPRINE HCL 5 MG PO TABS
5.0000 mg | ORAL_TABLET | Freq: Three times a day (TID) | ORAL | Status: DC | PRN
  Administered 2014-04-03 – 2014-04-04 (×2): 5 mg via ORAL
  Filled 2014-04-03 (×3): qty 1

## 2014-04-03 MED ORDER — ASPIRIN EC 81 MG PO TBEC: 81.0000 mg | DELAYED_RELEASE_TABLET | Freq: Every day | ORAL | Status: DC

## 2014-04-03 MED ORDER — ASPIRIN 300 MG RE SUPP: 300.0000 mg | RECTAL | Status: AC

## 2014-04-03 MED ORDER — NITROGLYCERIN 0.4 MG SL SUBL
0.4000 mg | SUBLINGUAL_TABLET | SUBLINGUAL | Status: AC | PRN
  Administered 2014-04-03 (×3): 0.4 mg via SUBLINGUAL
  Filled 2014-04-03: qty 1

## 2014-04-03 MED ORDER — OLMESARTAN MEDOXOMIL-HCTZ 40-25 MG PO TABS: 1.0000 | ORAL_TABLET | Freq: Every day | ORAL | Status: DC

## 2014-04-03 MED ORDER — BUDESONIDE-FORMOTEROL FUMARATE 160-4.5 MCG/ACT IN AERO: 2.0000 | INHALATION_SPRAY | Freq: Two times a day (BID) | RESPIRATORY_TRACT | Status: DC

## 2014-04-03 NOTE — Progress Notes (Signed)
ANTICOAGULATION CONSULT NOTE - Initial Consult  Pharmacy Consult for heparin Indication: chest pain/ACS  Allergies  Allergen Reactions  . Codeine Rash    Patient Measurements: Height: 5\' 3"  (160 cm) Weight: 155 lb (70.308 kg) IBW/kg (Calculated) : 52.4 Heparin Dosing Weight: 58 kg  Vital Signs: Temp: 97.6 F (36.4 C) (07/30 1043) Temp src: Oral (07/30 1043) BP: 146/77 mmHg (07/30 1315) Pulse Rate: 72 (07/30 1315)  Labs:  Recent Labs  04/03/14 1100  HGB 10.3*  HCT 32.1*  PLT 164  CREATININE 0.97  TROPONINI 0.38*    Estimated Creatinine Clearance: 48.6 ml/min (by C-G formula based on Cr of 0.97).   Medical History: Past Medical History  Diagnosis Date  . Colon polyps   . Hepatitis C   . Diverticulosis   . Diabetes mellitus   . Hypertension   . GERD (gastroesophageal reflux disease)   . Osteoporosis   . Hyperlipidemia     Medications:  See med history  Assessment: 73 yo lady to start heparin for r/o ACS.  She received 4000 unit bolus in the ED at 12:01.  Her baseline Hg 10.3 and PTLC 164.   Goal of Therapy:  Heparin level 0.3-0.7 units/ml Monitor platelets by anticoagulation protocol: Yes   Plan:  Start heparin drip at 800 units/hr Check heparin level 6 hours after start. Daily heparin level and CBC while on heparin Monitor for bleeding complications  Thanks for allowing pharmacy to be a part of this patient's care.  Excell Seltzer, PharmD Clinical Pharmacist, [phone] 04/03/2014,1:22 PM

## 2014-04-03 NOTE — ED Notes (Addendum)
Per [facility], pt from home for SOB that started this morning. resp rate in 50-60 when EMS arrived. Dropped to 38-40 upon arrival to ED. Pt is on home O2 at home PRN and was on 5 L/[place] when EMS arrived. Pt on 4 liters,  on arrival to ED. Dx with pulmonary fibrosis 3 yrs ago. Has bibasilar crackles. No edema. Sore throat, hurts to swallow, has productive cough with cream sputum. Pt states he chest is sore from coughing.

## 2014-04-03 NOTE — Progress Notes (Signed)
Pt arrived to [facility] 22 via stretcher from ED. Pt on monitor accompanied by RN. Pt moved to hospital bed and placed on cardiac monitor. ICU/Stepdown protocol followed. Pt in no apparent discomfort or distress, denies chest pain. Pt oriented to bed/unit/routine. Side rails up x2, bed low and locked. Call light and phone within reach. Orders reviewed.  Will continue to monitor. Roselyn Reef Presly Steinruck,RN

## 2014-04-03 NOTE — ED Notes (Signed)
Help pt up to bedside commode

## 2014-04-03 NOTE — ED Notes (Signed)
Troponin results given to Dr. Tawnya Crook

## 2014-04-03 NOTE — ED Notes (Signed)
CBG- 92 

## 2014-04-03 NOTE — ED Notes (Signed)
Up to the bedside commode before transportation

## 2014-04-03 NOTE — H&P (Addendum)
Patient ID: Margaret Adkins MRN: [id number], DOB/AGE: Jun 15, 1941   Admit date: 04/03/2014   Primary Physician: Crisoforo Oxford, PA-C Primary Cardiologist: None Primary Pulmonologist: Dr. Chase Caller  Pt. Profile:  73 year old woman admitted with probable NSTEMI.  History of pulmonary fibrosis and hepatitis C.  Problem List  Past Medical History  Diagnosis Date  . Colon polyps   . Hepatitis C   . Diverticulosis   . Diabetes mellitus   . Hypertension   . GERD (gastroesophageal reflux disease)   . Osteoporosis   . Hyperlipidemia     Past Surgical History  Procedure Laterality Date  . Cholecystectomy  2004  . Rotator cuff repair      right  . Abdominal hysterectomy      and LSO in 1980's  . Lipoma removal      abdomen  . Breast biopsy      left, benign  . Hemicolectomy      left, for diverticulitis (in [place])     Allergies  Allergies  Allergen Reactions  . Codeine Rash    HPI  This pleasant 73 year old woman came to ER by EMS after developing severe dyspnea associated with precordial chest discomfort at home. She has had recent chest "soreness" which she attributed to cough from her severe pulmonary fibrosis. She does not have any past history of CAD. She has a positive family history of heart problems and a personal history of diabetes, hypertension, and hypercholesterolemia. On arrival chest xray shows CHF superimposed on pulmonary fibrosis. Troponin 0.38. EKG shows new ST_T changes but no STEMI.  Home Medications  Prior to Admission medications   Medication Sig Start Date End Date Taking? Authorizing Provider  albuterol (PROVENTIL HFA;VENTOLIN HFA) 108 (90 BASE) MCG/ACT inhaler Inhale 2 puffs into the lungs every 6 (six) hours as needed for wheezing or shortness of breath. 02/12/14 10/07/15 Yes Brand Males, MD  aspirin 81 MG EC tablet Take 81 mg by mouth daily.     Yes Historical Provider, MD  budesonide-formoterol [facility]) 160-4.5 MCG/ACT inhaler 2  puffs then rinse mouth, twice daily 06/13/13  Yes Deneise Lever, MD  diclofenac sodium (VOLTAREN) 1 % GEL Apply 4 g topically daily as needed (pain).   Yes Historical Provider, MD  diphenhydrAMINE (BENADRYL) 25 mg capsule Take 25 mg by mouth every 6 (six) hours as needed for allergies.    Yes Historical Provider, MD  fish oil-omega-3 fatty acids 1000 MG capsule Take 2 g by mouth daily.   Yes Historical Provider, MD  HYDROcodone-homatropine (HYCODAN) 5-1.5 MG/5ML syrup Take 5 mLs by mouth 2 (two) times daily as needed for cough. 02/12/14  Yes Brand Males, MD  nitrofurantoin, macrocrystal-monohydrate, (MACROBID) 100 MG capsule Take 1 capsule (100 mg total) by mouth 2 (two) times daily. 03/17/14  Yes Camelia Eng Tysinger, PA-C  olmesartan-hydrochlorothiazide (BENICAR HCT) 40-25 MG per tablet Take 1 tablet by mouth daily.   Yes Historical Provider, MD  omeprazole (PRILOSEC) 40 MG capsule Take 1 capsule (40 mg total) by mouth daily. 12/23/13  Yes Camelia Eng Tysinger, PA-C  pioglitazone-metformin (ACTOPLUS MET) 15-500 MG per tablet Take 1 tablet by mouth 2 (two) times daily with a meal. 12/23/13  Yes Camelia Eng Tysinger, PA-C  pravastatin (PRAVACHOL) 40 MG tablet Take 1 tablet (40 mg total) by mouth daily. 12/23/13  Yes Camelia Eng Tysinger, PA-C  traMADol (ULTRAM) 50 MG tablet Take 50 mg by mouth every 6 (six) hours as needed for moderate pain. 1  or 2 every 6 hours if needed for pain 11/06/13  Yes Deneise Lever, MD  travoprost, benzalkonium, (TRAVATAN) 0.004 % ophthalmic solution Place 1 drop into both eyes at bedtime.     Yes Historical Provider, MD    Family History  Family History  Problem Relation Age of Onset  . Liver cancer Mother     Social History  History   Social History  . Marital Status: Single Divorced.    Spouse Name: N/A    Number of Children: N/A Children live out of town.  . Years of Education: N/A   Occupational History  . Not on file. Retired at age 17 from the [place] hospital system in  [place] History Main Topics  . Smoking status: Former Smoker -- 2.00 packs/day for 5 years    Types: Cigarettes    Quit date: 09/05/1958  . Smokeless tobacco: Never Used  . Alcohol Use: No     Comment: past use, quit in 1992  . Drug Use: No     Comment: past use quit in 1992  . Sexual Activity: Not on file   Other Topics Concern  . Not on file   Social History Narrative  . No narrative on file     Review of Systems General:  No chills, fever, night sweats or weight changes.  Cardiovascular:  No chest pain, dyspnea on exertion, edema, orthopnea, palpitations, paroxysmal nocturnal dyspnea. Dermatological: No rash, lesions/masses Respiratory: Pulmonary fibrosis. On home oxygen 4-5 L/min Urologic: No hematuria, dysuria Abdominal:   No nausea, vomiting, diarrhea, bright red blood per rectum, melena, or hematemesis Neurologic:  No visual changes, wkns, changes in mental status. All other systems reviewed and are otherwise negative except as noted above.  Physical Exam  Blood pressure 134/71, pulse 75, temperature 97.6 F (36.4 C), temperature source Oral, resp. rate 20, height _0  (1.6 m), weight 155 lb (70.308 kg), SpO2 100.00%.  General: Pleasant, NAD. Nasal oxygen in place. Psych: Normal affect. Neuro: Alert and oriented X 3. Moves all extremities spontaneously. HEENT: Normal  Neck: Supple without bruits or JVD. Lungs:  Bilateral inspiratory rales. Heart: RRR no s3, s4.  Grade 1/6 systolic ejection murmur LSE Abdomen: Soft, non-tender, non-distended, BS + x 4.  Extremities: No clubbing, cyanosis or edema. DP/PT/Radials 2+ and equal bilaterally.  Labs  Troponin [facility] Test)  Recent Labs  04/03/14 1108  TROPIPOC 0.13*    Recent Labs  04/03/14 1100  TROPONINI 0.38*   Lab Results  Component Value Date   WBC 6.9 04/03/2014   HGB 10.3* 04/03/2014   HCT 32.1* 04/03/2014   MCV 95.0 04/03/2014   PLT 164 04/03/2014     Recent Labs Lab  04/03/14 1100  NA 141  K 4.1  CL 99  CO2 28  BUN 13  CREATININE 0.97  CALCIUM 9.5  PROT 7.3  BILITOT 0.3  ALKPHOS 53  ALT 11  AST 23  GLUCOSE 83   Lab Results  Component Value Date   CHOL 134 11/27/2013   HDL 73 11/27/2013   LDLCALC 53 11/27/2013   TRIG 40 11/27/2013   Lab Results  Component Value Date   DDIMER 0.35 11/06/2013     Radiology/Studies  Dg Chest Port 1 View  04/03/2014   CLINICAL DATA:  Shortness of breath.  Chest pain.  EXAM: PORTABLE CHEST - 1 VIEW  COMPARISON:  Two-view chest 11/27/2013  FINDINGS: The heart is enlarged. The lung volumes are low. Mild  edema superimposed line chronic interstitial changes. Scarring at the lung apices is stable. Bilateral pleural effusions are suspected. Degenerative changes are noted in the spine and right shoulder.  IMPRESSION: 1. Cardiomegaly with mild edema and bilateral effusions suggesting early congestive heart failure. 2. Mild bibasilar atelectasis. No other significant focal airspace disease. 3. Stable chronic biapical scarring, right greater than left.   Electronically Signed   By: Lawrence Santiago M.D.   On: 04/03/2014 11:22    ECG  Sinus rhythm Abnormal T, consider ischemia, diffuse leads which is changed from prior Minimal ST elevation, lateral leads, also changed form prior Prolonged QT interval Confirmed by DOCHERTY MD,  ASSESSMENT AND PLAN  1. Probable NSTEMI  Question whether primary event or secondary to her severe pulmonary fibrosis.  2. Idiopathic pulmonary fibrosis. 3. History of hepatitis C being treated with [facility] through [facility]. 4. Essential hypertension 5. Diabetes mellitus type II  6. Hypecholesterolemia. 7. CHF type to be determined.  Plan: Admit to stepdown. Still having mild chest discomfort so will add IV NTG. Treat with ASA, IV heparin, statin, low dose BB in view of pulmonary disease, lasix. Cycle enzymes, check 2D echo. NPO after midnight tonite for possible  cath in am depending on clinical course and enzyme trend.  Continue pulmonary treatments. Consider pulmonary consult.  Signed, Darlin Coco, MD  04/03/2014, 12:21 PM

## 2014-04-03 NOTE — ED Notes (Signed)
Dr. Mare Ferrari at the bedside.

## 2014-04-03 NOTE — ED Notes (Signed)
Pt helped up to bedside commode

## 2014-04-03 NOTE — ED Provider Notes (Signed)
CSN: [id number]     Arrival date & time 04/03/14  1037 History   First MD Initiated Contact with Patient 04/03/14 1046     Chief Complaint  Patient presents with  . Shortness of Breath  . Respiratory Distress    pulmonary fibrosis     (Consider location/radiation/quality/duration/timing/severity/associated sxs/prior Treatment) Patient is a 73 y.o. female presenting with shortness of breath. The history is provided by the patient. No language interpreter was used.  Shortness of Breath Severity:  Severe Onset quality:  Sudden Duration:  10 hours Timing:  Constant Progression:  Improving Chronicity:  Recurrent Relieved by:  Oxygen and inhaler Worsened by:  Movement Ineffective treatments:  None tried Associated symptoms: chest pain and cough   Associated symptoms: no abdominal pain, no diaphoresis, no fever, no headaches, no neck pain, no sore throat, no sputum production and no vomiting   Chest pain:    Quality:  Pressure   Severity:  Moderate   Duration:  10 hours   Timing:  Intermittent   Progression:  Waxing and waning   Chronicity:  Recurrent (hx of similar for 1 year since pulm fibrosis diagnosed)   Past Medical History  Diagnosis Date  . Colon polyps   . Hepatitis C   . Diverticulosis   . Diabetes mellitus   . Hypertension   . GERD (gastroesophageal reflux disease)   . Osteoporosis   . Hyperlipidemia    Past Surgical History  Procedure Laterality Date  . Cholecystectomy  2004  . Rotator cuff repair      right  . Abdominal hysterectomy      and LSO in 1980's  . Lipoma removal      abdomen  . Breast biopsy      left, benign  . Hemicolectomy      left, for diverticulitis (in [place])   Family History  Problem Relation Age of Onset  . Liver cancer Mother    History  Substance Use Topics  . Smoking status: Former Smoker -- 2.00 packs/day for 5 years    Types: Cigarettes    Quit date: 09/05/1958  . Smokeless tobacco: Never Used  . Alcohol Use: No   Comment: past use, quit in 1992   OB History   Grav Para Term Preterm Abortions TAB SAB Ect Mult Living                 Review of Systems  Constitutional: Negative for fever, chills, diaphoresis, activity change, appetite change and fatigue.  HENT: Negative for congestion, facial swelling, rhinorrhea and sore throat.   Eyes: Negative for photophobia and discharge.  Respiratory: Positive for cough and shortness of breath. Negative for sputum production and chest tightness.   Cardiovascular: Positive for chest pain. Negative for palpitations and leg swelling.  Gastrointestinal: Negative for nausea, vomiting, abdominal pain and diarrhea.  Endocrine: Negative for polydipsia and polyuria.  Genitourinary: Negative for dysuria, frequency, difficulty urinating and pelvic pain.  Musculoskeletal: Negative for arthralgias, back pain, neck pain and neck stiffness.  Skin: Negative for color change and wound.  Allergic/Immunologic: Negative for immunocompromised state.  Neurological: Negative for facial asymmetry, weakness, numbness and headaches.  Hematological: Does not bruise/bleed easily.  Psychiatric/Behavioral: Negative for confusion and agitation.      Allergies  Codeine  Home Medications   Prior to Admission medications   Medication Sig Start Date End Date Taking? Authorizing Provider  albuterol (PROVENTIL HFA;VENTOLIN HFA) 108 (90 BASE) MCG/ACT inhaler Inhale 2 puffs into the lungs every 6 (six)  hours as needed for wheezing or shortness of breath. 02/12/14 10/07/15 Yes Brand Males, MD  aspirin 81 MG EC tablet Take 81 mg by mouth daily.     Yes Historical Provider, MD  budesonide-formoterol [facility]) 160-4.5 MCG/ACT inhaler 2 puffs then rinse mouth, twice daily 06/13/13  Yes Deneise Lever, MD  diclofenac sodium (VOLTAREN) 1 % GEL Apply 4 g topically daily as needed (pain).   Yes Historical Provider, MD  diphenhydrAMINE (BENADRYL) 25 mg capsule Take 25 mg by mouth every 6 (six)  hours as needed for allergies.    Yes Historical Provider, MD  fish oil-omega-3 fatty acids 1000 MG capsule Take 2 g by mouth daily.   Yes Historical Provider, MD  HYDROcodone-homatropine (HYCODAN) 5-1.5 MG/5ML syrup Take 5 mLs by mouth 2 (two) times daily as needed for cough. 02/12/14  Yes Brand Males, MD  nitrofurantoin, macrocrystal-monohydrate, (MACROBID) 100 MG capsule Take 1 capsule (100 mg total) by mouth 2 (two) times daily. 03/17/14  Yes Camelia Eng Tysinger, PA-C  olmesartan-hydrochlorothiazide (BENICAR HCT) 40-25 MG per tablet Take 1 tablet by mouth daily.   Yes Historical Provider, MD  omeprazole (PRILOSEC) 40 MG capsule Take 1 capsule (40 mg total) by mouth daily. 12/23/13  Yes Camelia Eng Tysinger, PA-C  pioglitazone-metformin (ACTOPLUS MET) 15-500 MG per tablet Take 1 tablet by mouth 2 (two) times daily with a meal. 12/23/13  Yes Camelia Eng Tysinger, PA-C  pravastatin (PRAVACHOL) 40 MG tablet Take 1 tablet (40 mg total) by mouth daily. 12/23/13  Yes Camelia Eng Tysinger, PA-C  traMADol (ULTRAM) 50 MG tablet Take 50 mg by mouth every 6 (six) hours as needed for moderate pain. 1 or 2 every 6 hours if needed for pain 11/06/13  Yes Deneise Lever, MD  travoprost, benzalkonium, (TRAVATAN) 0.004 % ophthalmic solution Place 1 drop into both eyes at bedtime.     Yes Historical Provider, MD   BP 146/77  Pulse 72  Temp(Src) 97.6 F (36.4 C) (Oral)  Resp 35  Ht _0  (1.6 m)  Wt 155 lb (70.308 kg)  BMI 27.46 kg/m2  SpO2 100% Physical Exam  Constitutional: She is oriented to person, place, and time. She appears well-developed and well-nourished. No distress.  HENT:  Head: Normocephalic and atraumatic.  Mouth/Throat: No oropharyngeal exudate.  Eyes: Pupils are equal, round, and reactive to light.  Neck: Normal range of motion. Neck supple.  Cardiovascular: Regular rhythm and normal heart sounds.  Tachycardia present.  Exam reveals no gallop and no friction rub.   No murmur heard. Pulmonary/Chest:  Tachypnea noted. No respiratory distress. She has no wheezes. She has rales in the right middle field and the left lower field.  Abdominal: Soft. Bowel sounds are normal. She exhibits no distension and no mass. There is no tenderness. There is no rebound and no guarding.  Musculoskeletal: Normal range of motion. She exhibits no edema and no tenderness.  Neurological: She is alert and oriented to person, place, and time.  Skin: Skin is warm and dry.  Psychiatric: She has a normal mood and affect.    ED Course  Procedures (including critical care time) Labs Review Labs Reviewed  CBC WITH DIFFERENTIAL - Abnormal; Notable for the following:    RBC 3.38 (*)    Hemoglobin 10.3 (*)    HCT 32.1 (*)    All other components within normal limits  COMPREHENSIVE METABOLIC PANEL - Abnormal; Notable for the following:    GFR calc non Af Amer 57 (*)  GFR calc Af Amer 66 (*)    All other components within normal limits  PRO B NATRIURETIC PEPTIDE - Abnormal; Notable for the following:    Pro B Natriuretic peptide (BNP) 1449.0 (*)    All other components within normal limits  TROPONIN I - Abnormal; Notable for the following:    Troponin I 0.38 (*)    All other components within normal limits  I-STAT TROPOININ, ED - Abnormal; Notable for the following:    Troponin i, poc 0.13 (*)    All other components within normal limits  TSH  T4, FREE  MAGNESIUM  HEMOGLOBIN A1C  HEPARIN LEVEL (UNFRACTIONATED)  CBG MONITORING, ED  I-STAT CG4 LACTIC ACID, ED    Imaging Review Dg Chest Port 1 View  04/03/2014   CLINICAL DATA:  Shortness of breath.  Chest pain.  EXAM: PORTABLE CHEST - 1 VIEW  COMPARISON:  Two-view chest 11/27/2013  FINDINGS: The heart is enlarged. The lung volumes are low. Mild edema superimposed line chronic interstitial changes. Scarring at the lung apices is stable. Bilateral pleural effusions are suspected. Degenerative changes are noted in the spine and right shoulder.  IMPRESSION: 1.  Cardiomegaly with mild edema and bilateral effusions suggesting early congestive heart failure. 2. Mild bibasilar atelectasis. No other significant focal airspace disease. 3. Stable chronic biapical scarring, right greater than left.   Electronically Signed   By: Lawrence Santiago M.D.   On: 04/03/2014 11:22     EKG Interpretation   Date/Time:  Thursday April 03 2014 10:43:06 EDT Ventricular Rate:  85 PR Interval:  153 QRS Duration: 87 QT Interval:  435 QTC Calculation: 517 R Axis:   54 Text Interpretation:  Sinus rhythm Abnormal T, consider ischemia, diffuse  leads which is changed from prior Minimal ST elevation, lateral leads,  also changed form prior Prolonged QT interval Confirmed by Canyon ([id number]) on 04/03/2014 11:26:58 AM     CRITICAL CARE Performed by: Ernestina Patches, E Total critical care time: 30 mins Critical care time was exclusive of separately billable procedures and treating other patients. Critical care was necessary to treat or prevent imminent or life-threatening deterioration. Critical care was time spent personally by me on the following activities: development of treatment plan with patient and/or surrogate as well as nursing, discussions with consultants, evaluation of patient's response to treatment, examination of patient, obtaining history from patient or surrogate, ordering and performing treatments and interventions, ordering and review of laboratory studies, ordering and review of radiographic studies, pulse oximetry and re-evaluation of patient's condition.   MDM   Final diagnoses:  NSTEMI (non-ST elevated myocardial infarction)  CHF exacerbation  IPF (idiopathic pulmonary fibrosis)  Chest pain on breathing    Pt is a 73 y.o. female with Pmhx as above who presents with sudden onset SOB around 2am, she also reports central chest pressure w/ onset of coughing, as well as lightheadedness. She states the chest pain is similar to CP she has had for  1 year in relation to coughing with her pulmonary fibrosis. She has has hx of sciatica and complains of L hip pain radiating down back of L thigh. On PE, Pt mildt tachycardic, crackels heard in BL bases. No LE edema. +chest wall tenderness. EKG w/ minimal STE in high lateral leads and new TWI in lateral leads. istat trop 0.13. ASA, SL NTG, heparin bolus orderd which checking lab Trop which was also elevated. CHF w/ sings of CHF, and BNP elevated form prior. Spoke w/  cardiology who reviewed EKG findings and did not feel this was a STEMI. I am in agreement. Will treat for NSTEMI, heparin gtt ordered. Cardiology will admit.         Neta Ehlers, MD 04/03/14 1331

## 2014-04-03 NOTE — ED Notes (Signed)
IV team called for second IV start

## 2014-04-03 NOTE — ED Notes (Signed)
Dr. Docherty at the bedside.  

## 2014-04-03 NOTE — Progress Notes (Signed)
ANTICOAGULATION CONSULT NOTE - Follow-up Consult  Pharmacy Consult for heparin Indication: chest pain/ACS  Allergies  Allergen Reactions  . Codeine Rash    Patient Measurements: Height: 5\' 3"  (160 cm) Weight: 155 lb (70.308 kg) IBW/kg (Calculated) : 52.4 Heparin Dosing Weight: 58 kg  Vital Signs: Temp: 97.8 F (36.6 C) (07/30 1900) Temp src: Oral (07/30 1900) BP: 134/117 mmHg (07/30 1900) Pulse Rate: 59 (07/30 1900)  Labs:  Recent Labs  04/03/14 1100 04/03/14 1906  HGB 10.3*  --   HCT 32.1*  --   PLT 164  --   HEPARINUNFRC  --  0.43  CREATININE 0.97  --   TROPONINI 0.38*  --     Estimated Creatinine Clearance: 48.6 ml/min (by C-G formula based on Cr of 0.97).  Assessment: 73 yo lady on heparin for r/o ACS.  Heparin level therapeutic on 800 units/hr. No bleeding noted.  Goal of Therapy:  Heparin level 0.3-0.7 units/ml Monitor platelets by anticoagulation protocol: Yes   Plan:  Continue heparin drip at 800 units/hr Daily heparin level and CBC while on heparin  Thanks for allowing pharmacy to be a part of this patient's care.  Sherlon Handing, PharmD, BCPS Clinical pharmacist, pager [phone] 04/03/2014,7:58 PM

## 2014-04-03 NOTE — ED Notes (Signed)
Pt assisted to bedside commode

## 2014-04-03 NOTE — Progress Notes (Signed)
Inilated visit with patient who Per [facility], pt from home for SOB that started this morning. Patient indicated that she had no family in [place] and that a friend would be notified after she had been examined.  I informed Bethena Roys and Mikki Santee in volunteer services of her presence here in ED per patient request.  Provided prayer,encouragement,  empathic listening and ministry of presence.   Will follow as needed.  04/03/14 1100  Clinical Encounter Type  Visited With Patient;Health care provider  Visit Type Spiritual support;ED;Trauma  Referral From Nurse  Spiritual Encounters  Spiritual Needs Prayer;Emotional  Stress Factors  Patient Stress Factors Health changes

## 2014-04-03 NOTE — ED Notes (Signed)
Meal tray ordered for pt  

## 2014-04-04 ENCOUNTER — Encounter (HOSPITAL_COMMUNITY)
Admission: EM | Disposition: A | Discharge status: Home / Self Care | Payer: Medicare Other | Source: Home / Self Care | Attending: Cardiology

## 2014-04-04 DIAGNOSIS — R079 Chest pain, unspecified: Secondary | ICD-10-CM

## 2014-04-04 DIAGNOSIS — I219 Acute myocardial infarction, unspecified: Secondary | ICD-10-CM

## 2014-04-04 HISTORY — PX: LEFT HEART CATHETERIZATION WITH CORONARY ANGIOGRAM: SHX5451

## 2014-04-04 LAB — PROTIME-INR
INR: 1.09 (ref 0.00–1.49)
PROTHROMBIN TIME: 14.1 s (ref 11.6–15.2)

## 2014-04-04 LAB — LIPID PANEL
Cholesterol: 156 mg/dL (ref 0–200)
HDL: 99 mg/dL (ref >39)
LDL CALC: 47 mg/dL (ref 0–99)
TRIGLYCERIDES: 48 mg/dL (ref <150)
Total CHOL/HDL Ratio: 1.6 RATIO
VLDL: 10 mg/dL (ref 0–40)

## 2014-04-04 LAB — CBC
HCT: 32.6 % — ABNORMAL LOW (ref 36.0–46.0)
HEMATOCRIT: 33.4 % — AB (ref 36.0–46.0)
HEMOGLOBIN: 10.6 g/dL — AB (ref 12.0–15.0)
HEMOGLOBIN: 10.7 g/dL — AB (ref 12.0–15.0)
MCH: 29.4 pg (ref 26.0–34.0)
MCH: 30.9 pg (ref 26.0–34.0)
MCHC: 31.7 g/dL (ref 30.0–36.0)
MCHC: 32.8 g/dL (ref 30.0–36.0)
MCV: 92.5 fL (ref 78.0–100.0)
MCV: 94.2 fL (ref 78.0–100.0)
Platelets: 182 10*3/uL (ref 150–400)
Platelets: 170 10*3/uL (ref 150–400)
RBC: 3.61 MIL/uL — ABNORMAL LOW (ref 3.87–5.11)
RBC: 3.46 MIL/uL — AB (ref 3.87–5.11)
RDW: 12.6 % (ref 11.5–15.5)
RDW: 12.8 % (ref 11.5–15.5)
WBC: 7.5 10*3/uL (ref 4.0–10.5)
WBC: 5.6 10*3/uL (ref 4.0–10.5)

## 2014-04-04 LAB — GLUCOSE, CAPILLARY
GLUCOSE-CAPILLARY: 98 mg/dL (ref 70–99)
Glucose-Capillary: 92 mg/dL (ref 70–99)
Glucose-Capillary: 81 mg/dL (ref 70–99)
Glucose-Capillary: 160 mg/dL — ABNORMAL HIGH (ref 70–99)
Glucose-Capillary: 127 mg/dL — ABNORMAL HIGH (ref 70–99)

## 2014-04-04 LAB — BASIC METABOLIC PANEL
ANION GAP: 15 (ref 5–15)
BUN: 16 mg/dL (ref 6–23)
CALCIUM: 9.5 mg/dL (ref 8.4–10.5)
CHLORIDE: 94 meq/L — AB (ref 96–112)
CO2: 33 mEq/L — ABNORMAL HIGH (ref 19–32)
Creatinine, Ser: 1.17 mg/dL — ABNORMAL HIGH (ref 0.50–1.10)
GFR calc non Af Amer: 45 mL/min — ABNORMAL LOW (ref >90)
GFR, EST AFRICAN AMERICAN: 52 mL/min — AB (ref >90)
Glucose, Bld: 89 mg/dL (ref 70–99)
Potassium: 3.7 mEq/L (ref 3.7–5.3)
Sodium: 142 mEq/L (ref 137–147)

## 2014-04-04 LAB — CREATININE, SERUM
CREATININE: 1.09 mg/dL (ref 0.50–1.10)
GFR calc non Af Amer: 49 mL/min — ABNORMAL LOW (ref >90)
GFR, EST AFRICAN AMERICAN: 57 mL/min — AB (ref >90)

## 2014-04-04 LAB — HEPARIN LEVEL (UNFRACTIONATED): HEPARIN UNFRACTIONATED: 0.57 [IU]/mL (ref 0.30–0.70)

## 2014-04-04 LAB — D-DIMER, QUANTITATIVE: D-Dimer, Quant: 0.3 ug/mL-FEU (ref 0.00–0.48)

## 2014-04-04 SURGERY — LEFT HEART CATHETERIZATION WITH CORONARY ANGIOGRAM
Anesthesia: LOCAL

## 2014-04-04 MED ORDER — SODIUM CHLORIDE 0.9 % IV SOLN
INTRAVENOUS | Status: DC
  Administered 2014-04-04: 10:00:00 via INTRAVENOUS

## 2014-04-04 MED ORDER — SODIUM CHLORIDE 0.9 % IJ SOLN: 3.0000 mL | INTRAMUSCULAR | Status: DC | PRN

## 2014-04-04 MED ORDER — SODIUM CHLORIDE 0.9 % IJ SOLN: 3.0000 mL | Freq: Two times a day (BID) | INTRAMUSCULAR | Status: DC

## 2014-04-04 MED ORDER — LIDOCAINE HCL (PF) 1 % IJ SOLN
INTRAMUSCULAR | Status: AC
  Filled 2014-04-04: qty 30

## 2014-04-04 MED ORDER — SODIUM CHLORIDE 0.9 % IV SOLN: 1.0000 mL/kg/h | INTRAVENOUS | Status: AC

## 2014-04-04 MED ORDER — SODIUM CHLORIDE 0.9 % IV SOLN: 250.0000 mL | INTRAVENOUS | Status: DC | PRN

## 2014-04-04 MED ORDER — SODIUM CHLORIDE 0.9 % IJ SOLN
3.0000 mL | Freq: Two times a day (BID) | INTRAMUSCULAR | Status: DC
  Administered 2014-04-05 – 2014-04-07 (×4): 3 mL via INTRAVENOUS

## 2014-04-04 MED ORDER — ATORVASTATIN CALCIUM 40 MG PO TABS
40.0000 mg | ORAL_TABLET | Freq: Every day | ORAL | Status: DC
  Administered 2014-04-04 – 2014-04-06 (×3): 40 mg via ORAL
  Filled 2014-04-04 (×4): qty 1

## 2014-04-04 MED ORDER — HEPARIN SODIUM (PORCINE) 5000 UNIT/ML IJ SOLN
5000.0000 [IU] | Freq: Three times a day (TID) | INTRAMUSCULAR | Status: DC
  Administered 2014-04-04 – 2014-04-07 (×8): 5000 [IU] via SUBCUTANEOUS
  Filled 2014-04-04 (×11): qty 1

## 2014-04-04 MED ORDER — ASPIRIN 81 MG PO CHEW: 81.0000 mg | CHEWABLE_TABLET | ORAL | Status: DC

## 2014-04-04 MED ORDER — FENTANYL CITRATE 0.05 MG/ML IJ SOLN
INTRAMUSCULAR | Status: AC
  Filled 2014-04-04: qty 2

## 2014-04-04 MED ORDER — MIDAZOLAM HCL 2 MG/2ML IJ SOLN
INTRAMUSCULAR | Status: AC
  Filled 2014-04-04: qty 2

## 2014-04-04 MED ORDER — HEPARIN SODIUM (PORCINE) 1000 UNIT/ML IJ SOLN
INTRAMUSCULAR | Status: AC
  Filled 2014-04-04: qty 1

## 2014-04-04 MED ORDER — HEPARIN (PORCINE) IN NACL 2-0.9 UNIT/ML-% IJ SOLN
INTRAMUSCULAR | Status: AC
  Filled 2014-04-04: qty 1000

## 2014-04-04 MED ORDER — NITROGLYCERIN 1 MG/10 ML FOR IR/CATH LAB
INTRA_ARTERIAL | Status: AC
  Filled 2014-04-04: qty 10

## 2014-04-04 MED ORDER — VERAPAMIL HCL 2.5 MG/ML IV SOLN
INTRAVENOUS | Status: AC
  Filled 2014-04-04: qty 2

## 2014-04-04 NOTE — Progress Notes (Signed)
Nutrition Brief Note  Patient identified on the Malnutrition Screening Tool (MST) Report  Wt Readings from Last 15 Encounters:  04/04/14 144 lb 2.9 oz (65.4 kg)  04/04/14 144 lb 2.9 oz (65.4 kg)  03/12/14 155 lb (70.308 kg)  02/12/14 151 lb 3.2 oz (68.584 kg)  01/06/14 154 lb (69.854 kg)  12/18/13 152 lb (68.947 kg)  12/13/13 157 lb 6.4 oz (71.396 kg)  11/27/13 151 lb 10.8 oz (68.8 kg)  11/27/13 150 lb (68.04 kg)  11/06/13 154 lb 9.6 oz (70.126 kg)  06/13/13 160 lb (72.576 kg)  05/13/13 161 lb (73.029 kg)  01/21/13 170 lb (77.111 kg)  12/13/12 168 lb (76.204 kg)  11/30/12 174 lb (78.926 kg)   Pt s/p cardiac cath. She reports her appetite is currently good. Also reports generally good appetite PTA. She reports some weight loss, due to sickness. Reports UBW of 150#. She is very active at baseline, reporting she walks daily and used to work at [employer] as a Psychologist, occupational. She denies any nutritional concerns. Nutrition focused physical exam reveals no signs of fat or muscle depletion.   Body mass index is 25.55 kg/(m^2). Patient meets criteria for overweight based on current BMI.   Current diet order is NPO (previous diet order Heart Healthy), patient is consuming approximately 100% of meals at this time. Labs and medications reviewed.   No nutrition interventions warranted at this time. If nutrition issues arise, please consult RD.   Brantly Kalman A. Jimmye Norman, RD, LDN Pager: [phone] After hours Pager: [phone]

## 2014-04-04 NOTE — Progress Notes (Addendum)
Subjective:    73 year old with pulmonary fibrosis, DM2, HTN, HL admitted with NSTEMI  Feeling better. Still with very mild CP.  But with progressive anterior (and inferior) TWI concerning for Wellon's Twaves.    Intake/Output Summary (Last 24 hours) at 04/04/14 0839 Last data filed at 04/04/14 0800  Gross per 24 hour  Intake 203.17 ml  Output   2675 ml  Net -2471.83 ml    Current meds: . aspirin  324 mg Oral NOW   Or  . aspirin  300 mg Rectal NOW  . aspirin EC  81 mg Oral Daily  . atorvastatin  80 mg Oral q1800  . budesonide-formoterol  2 puff Inhalation BID  . furosemide  40 mg Intravenous Q12H  . olmesartan  40 mg Oral Daily   And  . hydrochlorothiazide  25 mg Oral Daily  . insulin aspart  0-15 Units Subcutaneous TID WC  . metoprolol tartrate  12.5 mg Oral BID  . pantoprazole  40 mg Oral Daily  . Travoprost (BAK Free)  1 drop Both Eyes QHS   Infusions: . heparin 800 Units/hr (04/03/14 1800)  . nitroGLYCERIN Stopped (04/04/14 0102)     Objective:  Blood pressure 127/70, pulse 65, temperature 97.8 F (36.6 C), temperature source Oral, resp. rate 21, height 5\' 3"  (1.6 m), weight 65.4 kg (144 lb 2.9 oz), SpO2 100.00%. Weight change:   Physical Exam: General: Pleasant, NAD. Nasal oxygen in place.  Psych: Normal affect.  Neuro: Alert and oriented X 3. Moves all extremities spontaneously.  HEENT: Normal  Neck: Supple without bruits or JVD.  Lungs: Bilateral inspiratory rales.  Heart: RRR no s3, s4. Grade 1/6 systolic ejection murmur LSE  Abdomen: Soft, non-tender, non-distended, BS + x 4.  Extremities: No clubbing, cyanosis or edema. DP/PT   Telemetry:SR  Lab Results: Basic Metabolic Panel:  Recent Labs Lab 04/03/14 1100 04/03/14 1322 04/04/14 0256  NA 141  --  142  K 4.1  --  3.7  CL 99  --  94*  CO2 28  --  33*  GLUCOSE 83  --  89  BUN 13  --  16  CREATININE 0.97  --  1.17*  CALCIUM 9.5  --  9.5  MG  --  1.3*  --    Liver Function  Tests:  Recent Labs Lab 04/03/14 1100  AST 23  ALT 11  ALKPHOS 53  BILITOT 0.3  PROT 7.3  ALBUMIN 3.8   No results found for this basename: LIPASE, AMYLASE,  in the last 168 hours No results found for this basename: AMMONIA,  in the last 168 hours CBC:  Recent Labs Lab 04/03/14 1100 04/04/14 0256  WBC 6.9 7.5  NEUTROABS 4.4  --   HGB 10.3* 10.6*  HCT 32.1* 33.4*  MCV 95.0 92.5  PLT 164 182   Cardiac Enzymes:  Recent Labs Lab 04/03/14 1100  TROPONINI 0.38*   BNP: No components found with this basename: POCBNP,  CBG:  Recent Labs Lab 04/03/14 1040 04/03/14 1736 04/03/14 2057  GLUCAP 92 115* 101*   Microbiology: No results found for this basename: cult   No results found for this basename: CULT, SDES,  in the last 168 hours  Imaging: Dg Chest Port 1 View  04/03/2014   CLINICAL DATA:  Shortness of breath.  Chest pain.  EXAM: PORTABLE CHEST - 1 VIEW  COMPARISON:  Two-view chest 11/27/2013  FINDINGS: The heart is enlarged. The lung volumes are low. Mild edema superimposed  line chronic interstitial changes. Scarring at the lung apices is stable. Bilateral pleural effusions are suspected. Degenerative changes are noted in the spine and right shoulder.  IMPRESSION: 1. Cardiomegaly with mild edema and bilateral effusions suggesting early congestive heart failure. 2. Mild bibasilar atelectasis. No other significant focal airspace disease. 3. Stable chronic biapical scarring, right greater than left.   Electronically Signed   By: Lawrence Santiago M.D.   On: 04/03/2014 11:22     ASSESSMENT:  1. NSTEMI 2. Pulmonary fibrosis 3. DM2 4. HL 5. HCV  PLAN/DISCUSSION:  ECG very concerning for critical CAD. Will plan cath today. Continue heparin, asa. Has been put on high dose atorvastatin (is on prava 40 at home). With HCV will cut atorva back to 40. Continue low-dose lopressor as HR tolerates. BP well controlled.   If cath negative need to consider w/u for PE.    LOS: 1  day    Glori Bickers, MD 04/04/2014, 8:39 AM

## 2014-04-04 NOTE — Progress Notes (Signed)
Utilization review completed. Arpita Fentress, RN, BSN. 

## 2014-04-04 NOTE — Clinical Documentation Improvement (Signed)
Patient admitted with possible NSTEMI, has history of Pulmonary Fibrosis.    Is on Home O2 4-5L's  While in hospital remains on 2 to 4L's  Please provide a diagnosis associated with the data provided and treatment given while in-house.  Chronic Respiratory Failure Other Condition   Thank You, Zoila Shutter ,RN Clinical Documentation Specialist:  [facility] [employer] [facility]

## 2014-04-04 NOTE — CV Procedure (Signed)
    Cardiac Catheterization Procedure Note  Name: Margaret Adkins MRN: [id number] DOB: [birth year]  Procedure: Left Heart Cath, Selective Coronary Angiography, LV angiography  Indication: Chest pain, markedly abnormal EKG suggestive of anterior/multivessel ischemia   Procedural Details: The right wrist was prepped, draped, and anesthetized with 1% lidocaine. Using the modified Seldinger technique, a 5 French sheath was introduced into the right radial artery. 3 mg of verapamil was administered through the sheath, weight-based unfractionated heparin was administered intravenously. Standard Judkins catheters were used for selective coronary angiography and left ventriculography. Catheter exchanges were performed over an exchange length guidewire. There were no immediate procedural complications. A TR band was used for radial hemostasis at the completion of the procedure.  The patient was transferred to the post catheterization recovery area for further monitoring.  Procedural Findings: Hemodynamics: AO 136/65 LV 139/8  Coronary angiography: Coronary dominance: right  Left mainstem: Arises from the left cusp, widely patent without angiographic evidence of disease, divides into the LAD and left circumflex.  Left anterior descending (LAD): Moderate caliber vessel, smooth throughout its course, reaches the LV apex and wraps around the apex without obstruction.  Left circumflex (LCx): Widely patent vessels supplying 2 large obtuse marginal branches without stenoses.  Right coronary artery (RCA): Dominant vessel, smooth throughout its course, supplies the PDA and PLA branch without obstruction.  Left ventriculography: Left ventricular systolic function is normal, LVEF is estimated at 55%, there is no significant mitral regurgitation   Final Conclusions:   1. Angiographically normal coronary arteries 2. Normal LV systolic function with normal LVEDP  [name] MD 04/04/2014, 2:13 PM

## 2014-04-04 NOTE — Progress Notes (Signed)
Echo Lab  2D Echocardiogram completed.  [place], RDCS 04/04/2014 9:19 AM

## 2014-04-04 NOTE — Interval H&P Note (Signed)
History and Physical Interval Note:  04/04/2014 1:35 PM  [name]  has presented today for surgery, with the diagnosis of non-stemi  The various methods of treatment have been discussed with the patient and family. After consideration of risks, benefits and other options for treatment, the patient has consented to  Procedure(s): LEFT HEART CATHETERIZATION WITH CORONARY ANGIOGRAM (N/A) as a surgical intervention .  The patient's history has been reviewed, patient examined, no change in status, stable for surgery.  I have reviewed the patient's chart and labs.  Questions were answered to the patient's satisfaction.    Cath Lab Visit (complete for each Cath Lab visit)  Clinical Evaluation Leading to the Procedure:   ACS: Yes.    Non-ACS:    Anginal Classification: CCS IV  Anti-ischemic medical therapy: Minimal Therapy (1 class of medications)  Non-Invasive Test Results: No non-invasive testing performed  Prior CABG: No previous CABG       Margaret Adkins

## 2014-04-04 NOTE — H&P (View-Only) (Signed)
Subjective:    73 year old with pulmonary fibrosis, DM2, HTN, HL admitted with NSTEMI  Feeling better. Still with very mild CP.  But with progressive anterior (and inferior) TWI concerning for Wellon's Twaves.    Intake/Output Summary (Last 24 hours) at 04/04/14 0839 Last data filed at 04/04/14 0800  Gross per 24 hour  Intake 203.17 ml  Output   2675 ml  Net -2471.83 ml    Current meds: . aspirin  324 mg Oral NOW   Or  . aspirin  300 mg Rectal NOW  . aspirin EC  81 mg Oral Daily  . atorvastatin  80 mg Oral q1800  . budesonide-formoterol  2 puff Inhalation BID  . furosemide  40 mg Intravenous Q12H  . olmesartan  40 mg Oral Daily   And  . hydrochlorothiazide  25 mg Oral Daily  . insulin aspart  0-15 Units Subcutaneous TID WC  . metoprolol tartrate  12.5 mg Oral BID  . pantoprazole  40 mg Oral Daily  . Travoprost (BAK Free)  1 drop Both Eyes QHS   Infusions: . heparin 800 Units/hr (04/03/14 1800)  . nitroGLYCERIN Stopped (04/04/14 0102)     Objective:  Blood pressure 127/70, pulse 65, temperature 97.8 F (36.6 C), temperature source Oral, resp. rate 21, height 5\' 3"  (1.6 m), weight 65.4 kg (144 lb 2.9 oz), SpO2 100.00%. Weight change:   Physical Exam: General: Pleasant, NAD. Nasal oxygen in place.  Psych: Normal affect.  Neuro: Alert and oriented X 3. Moves all extremities spontaneously.  HEENT: Normal  Neck: Supple without bruits or JVD.  Lungs: Bilateral inspiratory rales.  Heart: RRR no s3, s4. Grade 1/6 systolic ejection murmur LSE  Abdomen: Soft, non-tender, non-distended, BS + x 4.  Extremities: No clubbing, cyanosis or edema. DP/PT   Telemetry:SR  Lab Results: Basic Metabolic Panel:  Recent Labs Lab 04/03/14 1100 04/03/14 1322 04/04/14 0256  NA 141  --  142  K 4.1  --  3.7  CL 99  --  94*  CO2 28  --  33*  GLUCOSE 83  --  89  BUN 13  --  16  CREATININE 0.97  --  1.17*  CALCIUM 9.5  --  9.5  MG  --  1.3*  --    Liver Function  Tests:  Recent Labs Lab 04/03/14 1100  AST 23  ALT 11  ALKPHOS 53  BILITOT 0.3  PROT 7.3  ALBUMIN 3.8   No results found for this basename: LIPASE, AMYLASE,  in the last 168 hours No results found for this basename: AMMONIA,  in the last 168 hours CBC:  Recent Labs Lab 04/03/14 1100 04/04/14 0256  WBC 6.9 7.5  NEUTROABS 4.4  --   HGB 10.3* 10.6*  HCT 32.1* 33.4*  MCV 95.0 92.5  PLT 164 182   Cardiac Enzymes:  Recent Labs Lab 04/03/14 1100  TROPONINI 0.38*   BNP: No components found with this basename: POCBNP,  CBG:  Recent Labs Lab 04/03/14 1040 04/03/14 1736 04/03/14 2057  GLUCAP 92 115* 101*   Microbiology: No results found for this basename: cult   No results found for this basename: CULT, SDES,  in the last 168 hours  Imaging: Dg Chest Port 1 View  04/03/2014   CLINICAL DATA:  Shortness of breath.  Chest pain.  EXAM: PORTABLE CHEST - 1 VIEW  COMPARISON:  Two-view chest 11/27/2013  FINDINGS: The heart is enlarged. The lung volumes are low. Mild edema superimposed  line chronic interstitial changes. Scarring at the lung apices is stable. Bilateral pleural effusions are suspected. Degenerative changes are noted in the spine and right shoulder.  IMPRESSION: 1. Cardiomegaly with mild edema and bilateral effusions suggesting early congestive heart failure. 2. Mild bibasilar atelectasis. No other significant focal airspace disease. 3. Stable chronic biapical scarring, right greater than left.   Electronically Signed   By: Lawrence Santiago M.D.   On: 04/03/2014 11:22     ASSESSMENT:  1. NSTEMI 2. Pulmonary fibrosis 3. DM2 4. HL 5. HCV  PLAN/DISCUSSION:  ECG very concerning for critical CAD. Will plan cath today. Continue heparin, asa. Has been put on high dose atorvastatin (is on prava 40 at home). With HCV will cut atorva back to 40. Continue low-dose lopressor as HR tolerates. BP well controlled.    LOS: 1 day    Glori Bickers, MD 04/04/2014, 8:39  AM

## 2014-04-04 NOTE — Progress Notes (Signed)
ANTICOAGULATION CONSULT NOTE - Follow-up Consult  Pharmacy Consult for heparin Indication: chest pain/ACS  Allergies  Allergen Reactions  . Codeine Rash    Patient Measurements: Height: 5\' 3"  (160 cm) Weight: 144 lb 2.9 oz (65.4 kg) IBW/kg (Calculated) : 52.4 Heparin Dosing Weight: 58 kg  Vital Signs: Temp: 97.8 F (36.6 C) (07/31 0816) Temp src: Oral (07/31 0816) BP: 147/73 mmHg (07/31 0900) Pulse Rate: 61 (07/31 0900)  Labs:  Recent Labs  04/03/14 1100 04/03/14 1906 04/04/14 0256 04/04/14 1039  HGB 10.3*  --  10.6*  --   HCT 32.1*  --  33.4*  --   PLT 164  --  182  --   LABPROT  --   --   --  14.1  INR  --   --   --  1.09  HEPARINUNFRC  --  0.43  --  0.57  CREATININE 0.97  --  1.17*  --   TROPONINI 0.38*  --   --   --     Estimated Creatinine Clearance: 38.9 ml/min (by C-G formula based on Cr of 1.17).  Assessment: 73 yo lady on heparin for r/o ACS.  Heparin level therapeutic on 800 units/hr. No bleeding noted. Cath planned for today.   Goal of Therapy:  Heparin level 0.3-0.7 units/ml Monitor platelets by anticoagulation protocol: Yes   Plan:  Continue heparin drip at 800 units/hr Daily heparin level and CBC while on heparin F/u after cath  Eudelia Bunch, Pharm.D. [phone] 04/04/2014 11:16 AM

## 2014-04-05 ENCOUNTER — Inpatient Hospital Stay (HOSPITAL_COMMUNITY): Payer: Medicare Other

## 2014-04-05 ENCOUNTER — Encounter (HOSPITAL_COMMUNITY): Payer: Self-pay | Admitting: Radiology

## 2014-04-05 DIAGNOSIS — R7989 Other specified abnormal findings of blood chemistry: Secondary | ICD-10-CM

## 2014-04-05 DIAGNOSIS — I079 Rheumatic tricuspid valve disease, unspecified: Secondary | ICD-10-CM

## 2014-04-05 DIAGNOSIS — I10 Essential (primary) hypertension: Secondary | ICD-10-CM

## 2014-04-05 DIAGNOSIS — E119 Type 2 diabetes mellitus without complications: Secondary | ICD-10-CM

## 2014-04-05 DIAGNOSIS — R9431 Abnormal electrocardiogram [ECG] [EKG]: Secondary | ICD-10-CM

## 2014-04-05 DIAGNOSIS — E7800 Pure hypercholesterolemia, unspecified: Secondary | ICD-10-CM

## 2014-04-05 LAB — GLUCOSE, CAPILLARY
Glucose-Capillary: 98 mg/dL (ref 70–99)
Glucose-Capillary: 115 mg/dL — ABNORMAL HIGH (ref 70–99)
Glucose-Capillary: 103 mg/dL — ABNORMAL HIGH (ref 70–99)
Glucose-Capillary: 113 mg/dL — ABNORMAL HIGH (ref 70–99)

## 2014-04-05 MED ORDER — IOHEXOL 350 MG/ML SOLN
60.0000 mL | Freq: Once | INTRAVENOUS | Status: AC | PRN
  Administered 2014-04-05: 60 mL via INTRAVENOUS

## 2014-04-05 NOTE — Progress Notes (Signed)
Patient has new order for STAT CT Angio Chest- Existing IV access is too small for this type of exam.  RN notified of need for new IV and stat exam order.

## 2014-04-05 NOTE — Progress Notes (Signed)
Consulting cardiologist: Glori Bickers MD Primary Cardiologist: Darlin Coco MD  Subjective:    She is feeling well. No recurrent chest pain. No shortness of breath. Ready to go home.   Objective:   Temp:  [97.1 F (36.2 C)-98 F (36.7 C)] 97.6 F (36.4 C) (08/01 0746) Pulse Rate:  [53-83] 61 (08/01 0746) Resp:  [16-26] 18 (08/01 0746) BP: (97-147)/(42-101) 112/70 mmHg (08/01 0746) SpO2:  [95 %-100 %] 99 % (08/01 0751) Weight:  [147 lb 0.8 oz (66.7 kg)] 147 lb 0.8 oz (66.7 kg) (08/01 0010)    Filed Weights   04/03/14 1044 04/04/14 0300 04/05/14 0010  Weight: 155 lb (70.308 kg) 144 lb 2.9 oz (65.4 kg) 147 lb 0.8 oz (66.7 kg)    Intake/Output Summary (Last 24 hours) at 04/05/14 0835 Last data filed at 04/05/14 0600  Gross per 24 hour  Intake  827.2 ml  Output   1500 ml  Net -672.8 ml    Telemetry: NSR   Exam:  General: No acute distress.  HEENT: Conjunctiva and lids normal, oropharynx clear.  Lungs: Clear to auscultation, nonlabored.  Cardiac: No elevated JVP or bruits. RRR, 1/6 systolic murmur, no gallop or rub.   Abdomen: Normoactive bowel sounds, nontender, nondistended.  Extremities: No pitting edema, distal pulses full.  Neuropsychiatric: Alert and oriented x3, affect appropriate.   Lab Results:  Basic Metabolic Panel:  Recent Labs Lab 04/03/14 1100 04/03/14 1322 04/04/14 0256 04/04/14 1620  NA 141  --  142  --   K 4.1  --  3.7  --   CL 99  --  94*  --   CO2 28  --  33*  --   GLUCOSE 83  --  89  --   BUN 13  --  16  --   CREATININE 0.97  --  1.17* 1.09  CALCIUM 9.5  --  9.5  --   MG  --  1.3*  --   --     Liver Function Tests:  Recent Labs Lab 04/03/14 1100  AST 23  ALT 11  ALKPHOS 53  BILITOT 0.3  PROT 7.3  ALBUMIN 3.8    CBC:  Recent Labs Lab 04/03/14 1100 04/04/14 0256 04/04/14 1620  WBC 6.9 7.5 5.6  HGB 10.3* 10.6* 10.7*  HCT 32.1* 33.4* 32.6*  MCV 95.0 92.5 94.2  PLT 164 182 170    Cardiac  Enzymes:  Recent Labs Lab 04/03/14 1100  TROPONINI 0.38*    BNP:  Recent Labs  11/27/13 1231 11/27/13 1730 04/03/14 1100  PROBNP 287.7* 226.3* 1449.0*    Coagulation:  Recent Labs Lab 04/04/14 1039  INR 1.09    Radiology: Dg Chest Port 1 View  04/03/2014   CLINICAL DATA:  Shortness of breath.  Chest pain.  EXAM: PORTABLE CHEST - 1 VIEW  COMPARISON:  Two-view chest 11/27/2013  FINDINGS: The heart is enlarged. The lung volumes are low. Mild edema superimposed line chronic interstitial changes. Scarring at the lung apices is stable. Bilateral pleural effusions are suspected. Degenerative changes are noted in the spine and right shoulder.  IMPRESSION: 1. Cardiomegaly with mild edema and bilateral effusions suggesting early congestive heart failure. 2. Mild bibasilar atelectasis. No other significant focal airspace disease. 3. Stable chronic biapical scarring, right greater than left.   Electronically Signed   By: Davionte Lusby Santiago M.D.   On: 04/03/2014 11:22   Cardiac Cath 04/04/2014 Coronary angiography:  Coronary dominance: right  Left mainstem: Arises from the left cusp,  widely patent without angiographic evidence of disease, divides into the LAD and left circumflex.  Left anterior descending (LAD): Moderate caliber vessel, smooth throughout its course, reaches the LV apex and wraps around the apex without obstruction.  Left circumflex (LCx): Widely patent vessels supplying 2 large obtuse marginal branches without stenoses.  Right coronary artery (RCA): Dominant vessel, smooth throughout its course, supplies the PDA and PLA branch without obstruction.  Left ventriculography: Left ventricular systolic function is normal, LVEF is estimated at 55%, there is no significant mitral regurgitation  Final Conclusions:  1. Angiographically normal coronary arteries  2. Normal LV systolic function with normal LVEDP    Medications:   Scheduled Medications: . aspirin  81 mg Oral Pre-Cath    . aspirin EC  81 mg Oral Daily  . atorvastatin  40 mg Oral q1800  . budesonide-formoterol  2 puff Inhalation BID  . furosemide  40 mg Intravenous Q12H  . heparin  5,000 Units Subcutaneous 3 times per day  . olmesartan  40 mg Oral Daily   And  . hydrochlorothiazide  25 mg Oral Daily  . insulin aspart  0-15 Units Subcutaneous TID WC  . metoprolol tartrate  12.5 mg Oral BID  . pantoprazole  40 mg Oral Daily  . sodium chloride  3 mL Intravenous Q12H  . Travoprost (BAK Free)  1 drop Both Eyes QHS    Infusions:    PRN Medications: sodium chloride, acetaminophen, albuterol, cyclobenzaprine, diclofenac sodium, diphenhydrAMINE, HYDROcodone-homatropine, nitroGLYCERIN, ondansetron (ZOFRAN) IV, sodium chloride, traMADol   Assessment and Plan:   1. NSTEMI: Troponin 0.30; with POC troponin 0.13, and 0.23 respectively. No recurrent chest pain. Cardiac cath  demonstrating normal coronary arteries. No further cardiac testing is planned. Continue current medication regimen. Will check CT to rule out PE.   2. Hypertension: Currently well controlled. No changes in medications  3. Pulmonary fibrosis:  Followed by pulmonology as OP. Continue regimen as outlined. Keep follow up appts.    4. Diabetes: Continue medical therapy.   Phill Myron. Adin Lariccia NP  04/05/2014, 8:35 AM

## 2014-04-05 NOTE — Progress Notes (Signed)
IV team Margaret Adkins into check IV 20 gauge right forearm. Both flushed and evaluated IV and stated it was patent although there was a bruise at the insertion site.

## 2014-04-05 NOTE — Progress Notes (Signed)
The patient was seen and examined, and I agree with the assessment and plan as documented above, with modifications as noted below. Pt admitted with chest pain, troponin elevation to 0.38, history of pulmonary fibrosis on oxygen, with marked T wave inversions inferiorly and anteriorly. Cath showed normal coronary arteries. Echo did not demonstrate significant valvular regurgitation with normal LV systolic function. Pt continues to have "chest soreness", but improved since admission. D-dimer normal. By auscultation, has murmur of tricuspid regurgitation which would be clinically consistent with known pulmonary fibrosis. Will proceed with CT angiography of the chest to rule out pulmonary embolism as etiology of chest pain, elevated troponin, ECG findings, and TR murmur.

## 2014-04-06 DIAGNOSIS — J9610 Chronic respiratory failure, unspecified whether with hypoxia or hypercapnia: Secondary | ICD-10-CM

## 2014-04-06 DIAGNOSIS — R0902 Hypoxemia: Secondary | ICD-10-CM

## 2014-04-06 DIAGNOSIS — J84112 Idiopathic pulmonary fibrosis: Secondary | ICD-10-CM

## 2014-04-06 LAB — GLUCOSE, CAPILLARY
GLUCOSE-CAPILLARY: 104 mg/dL — AB (ref 70–99)
Glucose-Capillary: 99 mg/dL (ref 70–99)
Glucose-Capillary: 120 mg/dL — ABNORMAL HIGH (ref 70–99)
Glucose-Capillary: 175 mg/dL — ABNORMAL HIGH (ref 70–99)

## 2014-04-06 MED ORDER — TRAMADOL HCL 50 MG PO TABS: 50.0000 mg | ORAL_TABLET | Freq: Four times a day (QID) | ORAL | Status: DC | PRN

## 2014-04-06 MED ORDER — BENZONATATE 100 MG PO CAPS
100.0000 mg | ORAL_CAPSULE | Freq: Three times a day (TID) | ORAL | Status: DC | PRN
  Administered 2014-04-06 (×2): 100 mg via ORAL
  Filled 2014-04-06 (×3): qty 1

## 2014-04-06 MED ORDER — PHENOL 1.4 % MT LIQD
1.0000 | OROMUCOSAL | Status: DC | PRN
  Administered 2014-04-06: 1 via OROMUCOSAL
  Filled 2014-04-06: qty 177

## 2014-04-06 NOTE — Progress Notes (Signed)
Pt complaining of feeling weak/fatigued this am. BP 96/52. Pt stated that is low for her. Margaret Adkins notified. Will hold off on am medications affecting BP until rounding MD sees pt. Will closely monitor

## 2014-04-06 NOTE — Consult Note (Signed)
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 Underwent catheterization on 7/31 showing normal coronaries and LV and LVEDP.  PCCM consulted due to IPF status.  STUDIES:  8/1 CT angio chest > no PE, UIP possibly worse since last study   SIGNIFICANT EVENTS: 7/31 LHC normal coronaries and LV and LVEDP   HISTORY OF PRESENT ILLNESS:  73 y/o female admitted on 7/31 with chest pain.  She was found to have an NSTEMI.  She underwent a catheterization on 7/31 showing normal coronaries and LV and LVEDP.  She continues to feel weak and dizzy.  She has a dry cough and denies chest congestion or sputum production.  Her dyspnea has been about the same in the last few weeks.  She does complain of a sore throat from coughing.  She uses 4 L O2 at home continuously.  Here in the hospital she has been coughing significantly.    PAST MEDICAL HISTORY :  Past Medical History  Diagnosis Date  . Colon polyps   . Hepatitis C   . Diverticulosis   . Diabetes mellitus   . Hypertension   . GERD (gastroesophageal reflux disease)   . Osteoporosis   . Hyperlipidemia    Past Surgical History  Procedure Laterality Date  . Cholecystectomy  2004  . Rotator cuff repair      right  . Abdominal hysterectomy      and LSO in 1980's  . Lipoma removal      abdomen  . Breast biopsy      left, benign  . Hemicolectomy      left, for diverticulitis (in [place])   Prior to Admission medications   Medication Sig Start Date End Date Taking? Authorizing Provider  albuterol (PROVENTIL HFA;VENTOLIN HFA) 108 (90 BASE) MCG/ACT inhaler Inhale 2 puffs into the lungs every 6 (six) hours as needed for wheezing or shortness of breath. 02/12/14 10/07/15 Yes Brand Males, MD  aspirin 81  MG EC tablet Take 81 mg by mouth daily.     Yes Historical Provider, MD  budesonide-formoterol [facility]) 160-4.5 MCG/ACT inhaler 2 puffs then rinse mouth, twice daily 06/13/13  Yes Deneise Lever, MD  diclofenac sodium (VOLTAREN) 1 % GEL Apply 4 g topically daily as needed (pain).   Yes Historical Provider, MD  diphenhydrAMINE (BENADRYL) 25 mg capsule Take 25 mg by mouth every 6 (six) hours as needed for allergies.    Yes Historical Provider, MD  fish oil-omega-3 fatty acids 1000 MG capsule Take 2 g by mouth daily.   Yes Historical Provider, MD  HYDROcodone-homatropine (HYCODAN) 5-1.5 MG/5ML syrup Take 5 mLs by mouth 2 (two) times daily as needed for cough. 02/12/14  Yes Brand Males, MD  nitrofurantoin, macrocrystal-monohydrate, (MACROBID) 100 MG capsule Take 1 capsule (100 mg total) by mouth 2 (two) times daily. 03/17/14  Yes Camelia Eng Tysinger, PA-C  olmesartan-hydrochlorothiazide (BENICAR HCT) 40-25 MG per tablet Take 1 tablet by mouth daily.   Yes Historical Provider, MD  omeprazole (PRILOSEC) 40 MG capsule Take 1 capsule (40 mg total) by mouth daily. 12/23/13  Yes Camelia Eng Tysinger, PA-C  pioglitazone-metformin (ACTOPLUS MET) 15-500 MG per tablet Take 1 tablet by mouth 2 (two) times daily with a meal. 12/23/13  Yes  Camelia Eng Tysinger, PA-C  pravastatin (PRAVACHOL) 40 MG tablet Take 1 tablet (40 mg total) by mouth daily. 12/23/13  Yes Camelia Eng Tysinger, PA-C  traMADol (ULTRAM) 50 MG tablet Take 50 mg by mouth every 6 (six) hours as needed for moderate pain. 1 or 2 every 6 hours if needed for pain 11/06/13  Yes Deneise Lever, MD  travoprost, benzalkonium, (TRAVATAN) 0.004 % ophthalmic solution Place 1 drop into both eyes at bedtime.     Yes Historical Provider, MD   Allergies  Allergen Reactions  . Codeine Rash    FAMILY HISTORY:  Family History  Problem Relation Age of Onset  . Liver cancer Mother    SOCIAL HISTORY:  reports that she quit smoking about 55 years ago. Her smoking use included  Cigarettes. She has a 10 pack-year smoking history. She has never used smokeless tobacco. She reports that she does not drink alcohol or use illicit drugs.  REVIEW OF SYSTEMS:   Gen: Denies fever, chills, weight change, + fatigue, night sweats HEENT: Denies blurred vision, double vision, hearing loss, tinnitus, sinus congestion, rhinorrhea, sore throat, neck stiffness, dysphagia PULM: per HPI CV: Denies current chest pain, edema, orthopnea, paroxysmal nocturnal dyspnea, palpitations GI: Denies abdominal pain, nausea, vomiting, diarrhea, hematochezia, melena, constipation, change in bowel habits GU: Denies dysuria, hematuria, polyuria, oliguria, urethral discharge Endocrine: Denies hot or cold intolerance, polyuria, polyphagia or appetite change Derm: Denies rash, dry skin, scaling or peeling skin change Heme: Denies easy bruising, bleeding, bleeding gums Neuro: Denies headache, numbness, notes general weakness, slurred speech, loss of memory or consciousness, + dizziness   SUBJECTIVE:   VITAL SIGNS: Temp:  [97.4 F (36.3 C)-98.2 F (36.8 C)] 98.2 F (36.8 C) (08/02 0509) Pulse Rate:  [61-63] 63 (08/02 0509) Resp:  [15-20] 15 (08/02 0509) BP: (96-115)/(52-65) 96/52 mmHg (08/02 0856) SpO2:  [95 %-100 %] 100 % (08/02 0509) HEMODYNAMICS:   VENTILATOR SETTINGS:   INTAKE / OUTPUT:  Intake/Output Summary (Last 24 hours) at 04/06/14 1200 Last data filed at 04/06/14 0826  Gross per 24 hour  Intake    960 ml  Output   2650 ml  Net  -1690 ml    PHYSICAL EXAMINATION: General:  No acute distress, sitting up in bed Neuro:  Awake and alert, no acute distress HEENT:  NCAT, PERRL Cardiovascular:  RRR, no mgr Lungs:  Crackles bilaterally, no wheezing, good air movement Abdomen:  BS+, soft, nontender Musculoskeletal:  Normal bulk and tone Skin:  No skin breakdown  LABS:  CBC  Recent Labs Lab 04/03/14 1100 04/04/14 0256 04/04/14 1620  WBC 6.9 7.5 5.6  HGB 10.3* 10.6* 10.7*   HCT 32.1* 33.4* 32.6*  PLT 164 182 170   Coag's  Recent Labs Lab 04/04/14 1039  INR 1.09   BMET  Recent Labs Lab 04/03/14 1100 04/04/14 0256 04/04/14 1620  NA 141 142  --   K 4.1 3.7  --   CL 99 94*  --   CO2 28 33*  --   BUN 13 16  --   CREATININE 0.97 1.17* 1.09  GLUCOSE 83 89  --    Electrolytes  Recent Labs Lab 04/03/14 1100 04/03/14 1322 04/04/14 0256  CALCIUM 9.5  --  9.5  MG  --  1.3*  --    Sepsis Markers  Recent Labs Lab 04/03/14 1110  LATICACIDVEN 1.44   ABG No results found for this basename: PHART, PCO2ART, PO2ART,  in the last 168 hours Liver Enzymes  Recent  Labs Lab 04/03/14 1100  AST 23  ALT 11  ALKPHOS 53  BILITOT 0.3  ALBUMIN 3.8   Cardiac Enzymes  Recent Labs Lab 04/03/14 1100  TROPONINI 0.38*  PROBNP 1449.0*   Glucose  Recent Labs Lab 04/05/14 0808 04/05/14 1134 04/05/14 1645 04/05/14 2049 04/06/14 0730 04/06/14 1130  GLUCAP 98 115* 103* 113* 99 104*    Imaging Ct Angio Chest Pe W/cm &/or Wo Cm  04/05/2014   CLINICAL DATA:  Chest pain, possible right heart strain at cardiac catheterization  EXAM: CT ANGIOGRAPHY CHEST WITH CONTRAST  TECHNIQUE: Multidetector CT imaging of the chest was performed using the standard protocol during bolus administration of intravenous contrast. Multiplanar CT image reconstructions and MIPs were obtained to evaluate the vascular anatomy.  CONTRAST:  39mL OMNIPAQUE IOHEXOL 350 MG/ML SOLN  COMPARISON:  Chest radiograph 04/03/2014, chest CT 11/19/2013  FINDINGS: The examination is adequate for evaluation for acute pulmonary embolism up to and including the 3rd order pulmonary arteries. No focal filling defect is identified up to and including the 3rd order pulmonary arteries to suggest acute pulmonary embolism. Cardiomegaly is noted. No pericardial or pleural effusion. No lymphadenopathy. Great vessels are normal in caliber, with incidental note made of bovine arch configuration.  Subpleural  reticular opacity sign are noted with areas of central bronchiectasis. These findings are not as well seen as on prior exams due to motion artifact but are again suggestive of usual interstitial pneumonitis, progressed from previous 2014 exam. Central airways are patent. No dominant lobar consolidation is seen.  No acute osseous abnormality.  Review of the MIP images confirms the above findings.  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  No CT evidence for acute pulmonary embolism.   Electronically Signed   By: Conchita Paris M.D.   On: 04/05/2014 13:13     ASSESSMENT / PLAN:  PULMONARY A: IPF> I have reviewed the CT images from yesterday. You can't really compare a high resolution CT to a CT angio, so unclear if there has been progression of the fibrosis in such a short interval.  That said, there is perhaps slightly more ground glass in the left base which is NOT a typical feature of IPF.  This could be explained by superimposed pulmonary edema or atlectasis.  Considering that her O2 saturation is 100% on her home dose of oxygen, I don't think that she has a significant change in her overall pulmonary status.  She doesn't like the hycodan for cough as it makes her nauseated P:   Keep follow up appointment with Dr. Chase Caller this month Try tramadol or tessalon prn cough No further changes recommended  CARDIOVASCULAR A: NSTEMI P:  Per cardiology  PCCM to sign off, call if questions  Roselie Awkward, MD [place] [facility] Pager: [phone] Cell: [phone] If no response, call [phone]   04/06/2014, 12:00 PM

## 2014-04-06 NOTE — Progress Notes (Signed)
Subjective:  Feels weak today and somewhat dizzy. CTA did not show any evidence of pulmonary embolus yesterday.  Objective:  Vital Signs in the last 24 hours: BP 96/52  Pulse 63  Temp(Src) 98.2 F (36.8 C) (Oral)  Resp 15  Ht 5\' 3"  (1.6 m)  Wt 66.7 kg (147 lb 0.8 oz)  BMI 26.05 kg/m2  SpO2 100%  Physical Exam: Pleasant black female in no acute distress Lungs:  Basilar crackles Cardiac:  Regular rhythm, normal S1 and S2, no S3, 2/6 systolic murmur Extremities:  No edema present  Intake/Output from previous day: 08/01 0701 - 08/02 0700 In: 840 [P.O.:840] Out: 2650 [Urine:2650]  Weight Filed Weights   04/03/14 1044 04/04/14 0300 04/05/14 0010  Weight: 70.308 kg (155 lb) 65.4 kg (144 lb 2.9 oz) 66.7 kg (147 lb 0.8 oz)    Lab Results: Basic Metabolic Panel:  Recent Labs  04/03/14 1100 04/04/14 0256 04/04/14 1620  NA 141 142  --   K 4.1 3.7  --   CL 99 94*  --   CO2 28 33*  --   GLUCOSE 83 89  --   BUN 13 16  --   CREATININE 0.97 1.17* 1.09   CBC:  Recent Labs  04/03/14 1100 04/04/14 0256 04/04/14 1620  WBC 6.9 7.5 5.6  NEUTROABS 4.4  --   --   HGB 10.3* 10.6* 10.7*  HCT 32.1* 33.4* 32.6*  MCV 95.0 92.5 94.2  PLT 164 182 170   Cardiac Enzymes:  Recent Labs  04/03/14 1100  TROPONINI 0.38*    Telemetry: Sinus rhythm  Assessment/Plan:  1. Significant pulmonary fibrosis 2. Abnormal EKG and positive troponin and with normal coronary arteries and no evidence of pulmonary embolus  Recommendations:  Her blood pressure is borderline at this time. We'll hold her furosemide. I will ask pulmonary to see her while she is in here. Pulmonary hypertension may be a cause of the abnormal EKG. She did not have a TR jet on her echo so could not be assessed and no right heart cath was done.     Kerry Hough  MD [facility] Cardiology  04/06/2014, 10:13 AM

## 2014-04-07 ENCOUNTER — Encounter (HOSPITAL_COMMUNITY): Payer: Self-pay | Admitting: Physician Assistant

## 2014-04-07 DIAGNOSIS — I248 Other forms of acute ischemic heart disease: Principal | ICD-10-CM

## 2014-04-07 LAB — GLUCOSE, CAPILLARY
GLUCOSE-CAPILLARY: 117 mg/dL — AB (ref 70–99)
GLUCOSE-CAPILLARY: 141 mg/dL — AB (ref 70–99)

## 2014-04-07 MED ORDER — BENZONATATE 100 MG PO CAPS: 100.0000 mg | ORAL_CAPSULE | Freq: Three times a day (TID) | ORAL | Status: DC | PRN

## 2014-04-07 MED ORDER — POLYETHYLENE GLYCOL 3350 17 G PO PACK
17.0000 g | PACK | Freq: Every day | ORAL | Status: DC | PRN
  Filled 2014-04-07: qty 1

## 2014-04-07 NOTE — Progress Notes (Signed)
Pt discharged home with family.  Reviewed discharge education and instructions, all questions answered. Assessment unchanged from earlier.

## 2014-04-07 NOTE — Discharge Summary (Signed)
Discharge Summary   Patient ID: Margaret Adkins,  MRN: [id number], DOB/AGE: 12/27/1940 73 y.o.  Admit date: 04/03/2014 Discharge date: 04/07/2014  Primary Care Provider: Crisoforo Oxford Primary Cardiologist: Dr. Mare Ferrari  Discharge Diagnoses Principal Problem:   Demand ischemia of myocardium Active Problems:   Type II or unspecified type diabetes mellitus without mention of complication, not stated as uncontrolled   Essential hypertension, benign   Pure hypercholesterolemia   GERD (gastroesophageal reflux disease)   Acute on chronic respiratory failure   IPF (idiopathic pulmonary fibrosis)   Chronic respiratory failure   Allergies Allergies  Allergen Reactions  . Codeine Rash    Procedures  Procedure: Left Heart Cath, Selective Coronary Angiography, LV angiography  Procedural Findings:  Hemodynamics:  AO 136/65  LV 139/8  Coronary angiography:  Coronary dominance: right  Left mainstem: Arises from the left cusp, widely patent without angiographic evidence of disease, divides into the LAD and left circumflex.  Left anterior descending (LAD): Moderate caliber vessel, smooth throughout its course, reaches the LV apex and wraps around the apex without obstruction.  Left circumflex (LCx): Widely patent vessels supplying 2 large obtuse marginal branches without stenoses.  Right coronary artery (RCA): Dominant vessel, smooth throughout its course, supplies the PDA and PLA branch without obstruction.  Left ventriculography: Left ventricular systolic function is normal, LVEF is estimated at 55%, there is no significant mitral regurgitation  Final Conclusions:  1. Angiographically normal coronary arteries  2. Normal LV systolic function with normal LVEDP   Echocardiogram 04/04/2014 LV EF: 50% - 55%  ------------------------------------------------------------------- Indications: MI - acute  410.91.  ------------------------------------------------------------------- History: PMH: Congestive heart failure. Risk factors: Hypertension. Diabetes mellitus. Dyslipidemia.  ------------------------------------------------------------------- Study Conclusions  - Left ventricle: The cavity size was normal. Wall thickness was normal. Systolic function was normal. The estimated ejection fraction was in the range of 50% to 55%. Wall motion was normal; there were no regional wall motion abnormalities. There was an increased relative contribution of atrial contraction to ventricular filling.   History of Present Illness  The patient is a 73 year old female with past medical history of diabetes, hypertension, hyperlipidemia, GERD and the significant history of pulmonary fibrosis. She presented to the ED on 04/03/2014 after developing severe dyspnea with precordial chest discomfort at home. Chest x-ray obtained in the ED showed CHF superimposed with pulmonary fibrosis. Initial troponin was elevated at 0.38. EKG also showed a new ST-T wave changes including significant TWI in inferior and anterior leads however no STEMI. ProBNP was elevated at 1449.  Patient was admitted for NSTEMI and further workup to differentiate between demand ischemia and true NSTEMI secondary to underlying coronary stenosis. Echocardiogram was obtained on 7/31 which showed EF of 50-55%, normal wall motion. Repeat EKG continued to show significant T wave inversion in the anterior leads and also inferior leads that was concerning for underlying coronary stenosis. She underwent cardiac catheterization on 7/31 which showed EF 55%, normal coronaries. CTA of the chest was obtained on the following day which showed no sign of pulmonary emboli, however does reveal probable progressive interstitial pneumonitis consistent with her history of pulmonary fibrosis. Pulmonary critical care was consult who reviewed the previous CT image,  however has not noticed any significant progression since the previous CT. It was recommended the patient continue to followup with her pulmonologist outpatient basis. Patient was seen the morning of 73/11/2013, at which time she denies any significant chest discomfort or shortness of breath. She was briefly placed on metoprolol due to the concern of  CAD, however metoprolol was discontinued given her signfiicant h/o pulmonary fibrosis. She continued to use nasal cannula in the hospital. She is deemed stable for discharge to resume on her home oxygen. I have left a message with afterhour scheduler who will contact the patient to schedule followup with Dr. Mare Ferrari in 2-4 weeks. She will also continue to follow up with Dr. Chase Caller per [facility] recommendation.   Discharge Vitals Blood pressure 121/61, pulse 65, temperature 97.9 F (36.6 C), temperature source Oral, resp. rate 18, height _0  (1.6 m), weight 147 lb 0.8 oz (66.7 kg), SpO2 98.00%.  Filed Weights   04/03/14 1044 04/04/14 0300 04/05/14 0010  Weight: 155 lb (70.308 kg) 144 lb 2.9 oz (65.4 kg) 147 lb 0.8 oz (66.7 kg)    Labs  CBC  Recent Labs  04/04/14 1620  WBC 5.6  HGB 10.7*  HCT 32.6*  MCV 94.2  PLT 712   Basic Metabolic Panel  Recent Labs  04/04/14 1620  CREATININE 1.09   D-Dimer  Recent Labs  04/04/14 1620  DDIMER 0.30   Disposition  Pt is being discharged home today in good condition.  Follow-up Plans & Appointments      Follow-up Information   Follow up with [facility] [place], MD On 04/30/2014. (9:00am. Previously scheduled appt)    Specialty:  Pulmonary Disease   Contact information:   [place] [address] [phone]       Follow up with Darlin Coco, MD. (Office will call you to schedule follow up. If you do not hear from [place] in 2 days, please give [place] a call to schedule followup)    Specialty:  Cardiology   Contact information:   [place] [address] [place]  [address] [phone]       Discharge Medications    Medication List    STOP taking these medications       nitrofurantoin (macrocrystal-monohydrate) 100 MG capsule  Commonly known as:  MACROBID      TAKE these medications       albuterol 108 (90 BASE) MCG/ACT inhaler  Commonly known as:  PROVENTIL HFA;VENTOLIN HFA  Inhale 2 puffs into the lungs every 6 (six) hours as needed for wheezing or shortness of breath.     aspirin 81 MG EC tablet  Take 81 mg by mouth daily.     benzonatate 100 MG capsule  Commonly known as:  TESSALON  Take 1 capsule (100 mg total) by mouth 3 (three) times daily as needed for cough.     budesonide-formoterol 160-4.5 MCG/ACT inhaler  Commonly known as:  SYMBICORT  2 puffs then rinse mouth, twice daily     diclofenac sodium 1 % Gel  Commonly known as:  VOLTAREN  Apply 4 g topically daily as needed (pain).     diphenhydrAMINE 25 mg capsule  Commonly known as:  BENADRYL  Take 25 mg by mouth every 6 (six) hours as needed for allergies.     fish oil-omega-3 fatty acids 1000 MG capsule  Take 2 g by mouth daily.     HYDROcodone-homatropine 5-1.5 MG/5ML syrup  Commonly known as:  HYCODAN  Take 5 mLs by mouth 2 (two) times daily as needed for cough.     olmesartan-hydrochlorothiazide 40-25 MG per tablet  Commonly known as:  BENICAR HCT  Take 1 tablet by mouth daily.     omeprazole 40 MG capsule  Commonly known as:  PRILOSEC  Take 1 capsule (40 mg total) by mouth  daily.     pioglitazone-metformin 15-500 MG per tablet  Commonly known as:  ACTOPLUS MET  Take 1 tablet by mouth 2 (two) times daily with a meal.     pravastatin 40 MG tablet  Commonly known as:  PRAVACHOL  Take 1 tablet (40 mg total) by mouth daily.     traMADol 50 MG tablet  Commonly known as:  ULTRAM  Take 50 mg by mouth every 6 (six) hours as needed for moderate pain. 1 or 2 every 6 hours if needed for pain     travoprost (benzalkonium) 0.004 % ophthalmic solution  Commonly  known as:  TRAVATAN  Place 1 drop into both eyes at bedtime.        Duration of Discharge Encounter   Greater than 30 minutes including physician time.  Hilbert Corrigan PA-C 04/07/2014, 12:03 PM

## 2014-04-07 NOTE — Progress Notes (Signed)
Patient Name: Margaret Adkins Date of Encounter: 04/07/2014     Principal Problem:   Demand ischemia of myocardium  - negative cath and CTA of chest Active Problems:   Type II or unspecified type diabetes mellitus without mention of complication, not stated as uncontrolled   Essential hypertension, benign   Pure hypercholesterolemia   GERD (gastroesophageal reflux disease)   Acute on chronic respiratory failure   IPF (idiopathic pulmonary fibrosis)   Chronic respiratory failure    SUBJECTIVE  Denies any CP or SOB. On [place]  CURRENT MEDS . aspirin EC  81 mg Oral Daily  . atorvastatin  40 mg Oral q1800  . budesonide-formoterol  2 puff Inhalation BID  . heparin  5,000 Units Subcutaneous 3 times per day  . olmesartan  40 mg Oral Daily   And  . hydrochlorothiazide  25 mg Oral Daily  . insulin aspart  0-15 Units Subcutaneous TID WC  . metoprolol tartrate  12.5 mg Oral BID  . pantoprazole  40 mg Oral Daily  . sodium chloride  3 mL Intravenous Q12H  . Travoprost (BAK Free)  1 drop Both Eyes QHS    OBJECTIVE  Filed Vitals:   04/06/14 1446 04/06/14 2035 04/06/14 2041 04/07/14 0532  BP: 107/63  118/63 121/61  Pulse: 70  61 62  Temp: 97.4 F (36.3 C)  98 F (36.7 C) 97.9 F (36.6 C)  TempSrc: Oral  Oral Oral  Resp: 18  16 16   Height:      Weight:      SpO2: 100% 100% 100% 97%    Intake/Output Summary (Last 24 hours) at 04/07/14 0646 Last data filed at 04/07/14 0417  Gross per 24 hour  Intake    240 ml  Output    900 ml  Net   -660 ml   Filed Weights   04/03/14 1044 04/04/14 0300 04/05/14 0010  Weight: 155 lb (70.308 kg) 144 lb 2.9 oz (65.4 kg) 147 lb 0.8 oz (66.7 kg)    PHYSICAL EXAM  General: Pleasant, NAD. Neuro: Alert and oriented X 3. Moves all extremities spontaneously. Psych: Normal affect. HEENT:  Normal  Neck: Supple without bruits or JVD. Lungs:  Resp regular and unlabored, markedly decreased breath sound, intermittent rale Heart: RRR no s3, s4,  or murmurs. Abdomen: Soft, non-tender, non-distended, BS + x 4.  Extremities: No clubbing, cyanosis or edema. DP/PT/Radials 2+ and equal bilaterally.  Accessory Clinical Findings  CBC  Recent Labs  04/04/14 1620  WBC 5.6  HGB 10.7*  HCT 32.6*  MCV 94.2  PLT 182   Basic Metabolic Panel  Recent Labs  04/04/14 1620  CREATININE 1.09   D-Dimer  Recent Labs  04/04/14 1620  DDIMER 0.30    TELE  NSR with HR 60-70s, no significant ventricular ectopy  ECG  8/1 sinus brady with HR 50s, deep TWI in inferior and anterior lead  Radiology/Studies  Ct Angio Chest Pe W/cm &/or Wo Cm  04/05/2014   CLINICAL DATA:  Chest pain, possible right heart strain at cardiac catheterization  EXAM: CT ANGIOGRAPHY CHEST WITH CONTRAST  TECHNIQUE: Multidetector CT imaging of the chest was performed using the standard protocol during bolus administration of intravenous contrast. Multiplanar CT image reconstructions and MIPs were obtained to evaluate the vascular anatomy.  CONTRAST:  58mL OMNIPAQUE IOHEXOL 350 MG/ML SOLN  COMPARISON:  Chest radiograph 04/03/2014, chest CT 11/19/2013  FINDINGS: The examination is adequate for evaluation for acute pulmonary embolism up to and including the  3rd order pulmonary arteries. No focal filling defect is identified up to and including the 3rd order pulmonary arteries to suggest acute pulmonary embolism. Cardiomegaly is noted. No pericardial or pleural effusion. No lymphadenopathy. Great vessels are normal in caliber, with incidental note made of bovine arch configuration.  Subpleural reticular opacity sign are noted with areas of central bronchiectasis. These findings are not as well seen as on prior exams due to motion artifact but are again suggestive of usual interstitial pneumonitis, progressed from previous 2014 exam. Central airways are patent. No dominant lobar consolidation is seen.  No acute osseous abnormality.  Review of the MIP images confirms the above  findings.  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  No CT evidence for acute pulmonary embolism.   Electronically Signed   By: Conchita Paris M.D.   On: 04/05/2014 13:13   Dg Chest Port 1 View  04/03/2014   CLINICAL DATA:  Shortness of breath.  Chest pain.  EXAM: PORTABLE CHEST - 1 VIEW  COMPARISON:  Two-view chest 11/27/2013  FINDINGS: The heart is enlarged. The lung volumes are low. Mild edema superimposed line chronic interstitial changes. Scarring at the lung apices is stable. Bilateral pleural effusions are suspected. Degenerative changes are noted in the spine and right shoulder.  IMPRESSION: 1. Cardiomegaly with mild edema and bilateral effusions suggesting early congestive heart failure. 2. Mild bibasilar atelectasis. No other significant focal airspace disease. 3. Stable chronic biapical scarring, right greater than left.   Electronically Signed   By: Lawrence Santiago M.D.   On: 04/03/2014 11:22    ASSESSMENT AND PLAN  73 yo female with h/o HTN, hyperlipidemia, DM and pulm fibrosis presented with CP, EKG changes and mildly elevated troponin concerning for NSTEMI. Cath normal. CT of chest negative for PE.  Pulm critical care consulted for evaluation of her Pulm fibrosis. Previous CT reviewed, no definitive sign of progression. [facility] signed off   1. Elevated troponin likely demand ischemia 2/2 significant pulm fibrosis  - EKG TWI in inferior lead and V1-V4  - Cath 04/04/2014 normal coronaries, EF 55%, normal LVEDP  - Echo 04/04/2014 EF 50-55%, no regional wall motion abn.  - CTA 02/03/2014 negative for PE  - stable for discharge today (with her Pulm fibrosis and negative CAD, consider resume home med without metoprolol which started during this admission)    2. HTN 3. Pulm fibrosis: CT images reviewed by [facility], no definitive sign of progression, recommend  continue OP followup 4. DM 5. Hyperlipidemia 6. History of hepatitis C being treated with [facility] through [facility].   Hilbert Corrigan PA Pager: [phone] Agree with above assessment and plan.  The patient is ready for discharge today.  We will discontinue her metoprolol which was started this admission.  Blood pressures have been soft this admission.  See Dr. Mare Ferrari in followup in several weeks for office visit and EKG.  Continue to follow up closely with pulmonary regarding her pulmonary fibrosis.

## 2014-04-07 NOTE — Discharge Instructions (Signed)
Chest Pain (Nonspecific) It is often hard to give a diagnosis for the cause of chest pain. There is always a chance that your pain could be related to something serious, such as a heart attack or a blood clot in the lungs. You need to follow up with your doctor. HOME CARE  If antibiotic medicine was given, take it as directed by your doctor. Finish the medicine even if you start to feel better.  For the next few days, avoid activities that bring on chest pain. Continue physical activities as told by your doctor.  Do not use any tobacco products. This includes cigarettes, chewing tobacco, and e-cigarettes.  Avoid drinking alcohol.  Only take medicine as told by your doctor.  Follow your doctor's suggestions for more testing if your chest pain does not go away.  Keep all doctor visits you made. GET HELP IF:  Your chest pain does not go away, even after treatment.  You have a rash with blisters on your chest.  You have a fever. GET HELP RIGHT AWAY IF:   You have more pain or pain that spreads to your arm, neck, jaw, back, or belly (abdomen).  You have shortness of breath.  You cough more than usual or cough up blood.  You have very bad back or belly pain.  You feel sick to your stomach (nauseous) or throw up (vomit).  You have very bad weakness.  You pass out (faint).  You have chills. This is an emergency. Do not wait to see if the problems will go away. Call your local emergency services (911 in [place] Do not drive yourself to the hospital. MAKE SURE YOU:   Understand these instructions.  Will watch your condition.  Will get help right away if you are not doing well or get worse. Document Released: 02/08/2008 Document Revised: 08/27/2013 Document Reviewed: 02/08/2008 [facility] Patient Information 2015 [place], [place]. This information is not intended to replace advice given to you by your health care provider. Make sure you discuss any questions you have with your  health care provider.  No driving for 24 hours. No lifting over 5 lbs for 1 week. No sexual activity for 1 week. Keep procedure site clean & dry. If you notice increased pain, swelling, bleeding or pus, call/return!  You may shower, but no soaking baths/hot tubs/pools for 1 week.

## 2014-04-07 NOTE — Care Management Note (Signed)
    Page 1 of 1   04/07/2014     11:14:20 AM CARE MANAGEMENT NOTE 04/07/2014  Patient:  Margaret Adkins, Margaret Adkins   Account Number:  [id number]  Date Initiated:  04/07/2014  Documentation initiated by:  GRAVES-BIGELOW,Letishia Elliott  Subjective/Objective Assessment:   Pt admitted for cp. S/p cath. Plan for d/c 04-07-14.     Action/Plan:   No needs from CM at this time.   Anticipated DC Date:  04/07/2014   Anticipated DC Plan:  [place]  CM consult      Choice offered to / List presented to:             Status of service:   Medicare Important Message given?  YES (If response is "NO", the following Medicare IM given date fields will be blank) Date Medicare IM given:  04/07/2014 Medicare IM given by:  GRAVES-BIGELOW,Maigen Mozingo Date Additional Medicare IM given:   Additional Medicare IM given by:    Discharge Disposition:  [place]  Per UR Regulation:  Reviewed for med. necessity/level of care/duration of stay  If discussed at [place] of Stay Meetings, dates discussed:    Comments:

## 2014-04-21 ENCOUNTER — Ambulatory Visit: Payer: Medicare Other | Admitting: Internal Medicine

## 2014-04-30 ENCOUNTER — Encounter: Payer: Self-pay | Admitting: Cardiology

## 2014-04-30 ENCOUNTER — Encounter (INDEPENDENT_AMBULATORY_CARE_PROVIDER_SITE_OTHER): Payer: Self-pay

## 2014-04-30 ENCOUNTER — Ambulatory Visit (INDEPENDENT_AMBULATORY_CARE_PROVIDER_SITE_OTHER): Payer: Medicare Other | Admitting: Cardiology

## 2014-04-30 ENCOUNTER — Encounter: Payer: Self-pay | Admitting: Internal Medicine

## 2014-04-30 ENCOUNTER — Ambulatory Visit (INDEPENDENT_AMBULATORY_CARE_PROVIDER_SITE_OTHER): Payer: Medicare Other | Admitting: Internal Medicine

## 2014-04-30 VITALS — BP 110/72 | HR 91 | Ht 63.0 in | Wt 145.0 lb

## 2014-04-30 VITALS — BP 116/58 | HR 93 | Ht 64.0 in | Wt 145.0 lb

## 2014-04-30 DIAGNOSIS — J9611 Chronic respiratory failure with hypoxia: Secondary | ICD-10-CM

## 2014-04-30 DIAGNOSIS — IMO0001 Reserved for inherently not codable concepts without codable children: Secondary | ICD-10-CM | POA: Insufficient documentation

## 2014-04-30 DIAGNOSIS — I2510 Atherosclerotic heart disease of native coronary artery without angina pectoris: Secondary | ICD-10-CM

## 2014-04-30 DIAGNOSIS — Z0389 Encounter for observation for other suspected diseases and conditions ruled out: Secondary | ICD-10-CM

## 2014-04-30 DIAGNOSIS — I5189 Other ill-defined heart diseases: Secondary | ICD-10-CM | POA: Insufficient documentation

## 2014-04-30 DIAGNOSIS — E119 Type 2 diabetes mellitus without complications: Secondary | ICD-10-CM

## 2014-04-30 DIAGNOSIS — R0902 Hypoxemia: Secondary | ICD-10-CM

## 2014-04-30 DIAGNOSIS — I248 Other forms of acute ischemic heart disease: Secondary | ICD-10-CM

## 2014-04-30 DIAGNOSIS — J9610 Chronic respiratory failure, unspecified whether with hypoxia or hypercapnia: Secondary | ICD-10-CM

## 2014-04-30 DIAGNOSIS — J84112 Idiopathic pulmonary fibrosis: Secondary | ICD-10-CM

## 2014-04-30 DIAGNOSIS — I519 Heart disease, unspecified: Secondary | ICD-10-CM

## 2014-04-30 DIAGNOSIS — I10 Essential (primary) hypertension: Secondary | ICD-10-CM

## 2014-04-30 MED ORDER — BUDESONIDE-FORMOTEROL FUMARATE 160-4.5 MCG/ACT IN AERO: INHALATION_SPRAY | RESPIRATORY_TRACT | Status: AC

## 2014-04-30 MED ORDER — ALBUTEROL SULFATE HFA 108 (90 BASE) MCG/ACT IN AERS: 2.0000 | INHALATION_SPRAY | Freq: Four times a day (QID) | RESPIRATORY_TRACT | Status: AC | PRN

## 2014-04-30 MED ORDER — NITROGLYCERIN 0.4 MG SL SUBL: 0.4000 mg | SUBLINGUAL_TABLET | SUBLINGUAL | Status: AC | PRN

## 2014-04-30 NOTE — Progress Notes (Signed)
Subjective:    Patient ID: Margaret Adkins, female    DOB: 1940/09/30, 73 y.o.   MRN: [id number]  HPI   10/04/12- 73 yoF former smoker referred by [name], PA. She describes shortness of breath x 6 months with no prior respiratory complaint. Pain left posterior lateral ribs for about the same interval, related to deep breath and twisting. Chronic cough with scant thick white sputum. Occasional night sweats without weight change or adenopathy. No blood or fever and nothing purulent. Has been anemic. History of hepatitis C. She has Ventolin and Symbicort used unevenly. She is unclear that they help. PFT 08/22/12- severe restriction, mild obstruction with resp to BD, DLCo severely reduced. FEV1 0.91/48%, FVC 1.12/ 41%, FEV1/FVC 0.81, FEF 25-75% 1.01/ 46%, TLC 46%, DLCO 28% CXR 11/17/11 IMPRESSION:  1. No acute cardia no pulmonary abnormalities.  2. Chronic interstitial changes, similar to previous exam.  Original Report Authenticated By: Angelita Ingles, M.D. MR chest 09/24/10 IMPRESSION:  1. 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 Although possibly from severe degenerative  arthropathy, the edema in the joint and adjacent osseous structures  raises the possibility of septic joint and osteomyelitis. No extra-  articular fluid collection is identified to provide greater  positive predictive value for septic joint. Correlate with  chronicity in onset of symptoms as well as other signs of infection  in determining the need for treatment or attempt at arthrocentesis.  2. Suspected atypical infectious bronchiolitis in the right lung  apex.  Provider: Weyman Rodney CT chest 07/20/10 IMPRESSION: Multifocal areas of peribronchial opacities involving  the upper lobes along with diffuse bronchial wall thickening is  most compatible with bronchopneumonia.  Provider: Elizabeth Sauer  11/06/12- 73 yoF former smoker referred by [name], PA. FOLLOWS FOR: has not had 6MW test done; Increased SOB-getting worse. Admits she has been struggling along with shortness of breath with exertion probably several months. An intermittent, somewhat pleuritic pain, left lateral chest is not new but is increased when she coughs. Notices frequent coughing spells with thick white sputum. Denies fever, blood, adenopathy, swelling, anginal pain or palpitation. PFT: 08/22/2012 severe restriction of total lung capacity, mild obstructive airways disease in small airways with response to bronchodilator, diffusion severely reduced. FVC 1.12/41%, FEV1 0.91/48%, FEV1/FVC 0.81, FEF 25-75% 1.01/46%. TLC 46%, DLCO 28%. CT chest 10/17/12 IMPRESSION:  1. Chronic interstitial changes of scarring, reticulation, and  cylindrical type bronchiectasis noted. Although there is no zonal  predominance or evidence of frank honeycombing these findings are  suspicious for early usual interstitial pneumonitis.  Original Report Authenticated By: Kerby Moors, M.D. L Rib Xray 09/03/12- NEG ANA neg, Sed rate 15  12/13/12-73 yoF former smoker followed for Interstitial lung disease/ fibrosis/ bronchiectasis.   PCP [name], PA. FOLLOWS FOR: review ONO and 6MW test results with patient.  Pt states breathing is better since last OV. Pt would like to have Rx for Tussionex. Still coughing. Denies seasonal allergy. Sputum cultures from 11/06/2012 all negative so far with normal flora. Overnight oximetry 11/07/2012- adequate, not qualify for O2 during sleep. 6MWT- 12/13/12- 94%, 79%, 96%, 447 m. Desaturated during exertion. Good recovery.  06/13/13- 73 yoF former smoker followed for Interstitial lung disease/ fibrosis/bronchiectasis, complicated by hepatitis C.   PCP [name], PA FOLLOWS FOR: chest tightness, increased wheezing; needs surgery clearance for cataract surgery(anticipated date is  06-25-13) Gradually more dyspnea with exertion stops her every couple  of rooms. Still on home oxygen prescribed from [facility] clinic for sleep and exertion when she was there this summer. They diagnosed pulmonary fibrosis with no biopsy. Cough productive thick white sputum, no fever, blood or chest pain. Tussionex not much help for her cough. We reviewed images on disc from [facility] clinic  11/06/13- 73 yoF former smoker followed for Interstitial lung disease/ fibrosis/bronchiectasis, complicated by hepatitis C.   PCP [name], PA FOLLOWS FOR: continues to have increased SOB even with O2 on.  CT reported in dc summary from [facility] 04/21/13 had referred to progression of pulmonary fibrosis, unspecified, negative for PE.  ANA and Sed rate Neg. WNL 09/2012. Has been w family in [place] through winter and Rx'd there for pneumonia outpt. Continues O2 2L/ [employer].  C/O persistent pain L post lat ribs x 3-4 months, worse w deep breath, never gone. Rx'd Aleve. No fever. Cough prod thick white. No palpitation, leg pain, ankle edema, nodes or masses.   12/13/13- 73 yoF former smoker followed for Interstitial lung disease/ UIP/ bronchiectasis, complicated by hepatitis C.   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 Discharged on prednisone 40 mg daily. Still coughing, productive clear mucus. CT c/w UIP- images reviewed w/ her.  Now has O2 4l/ [employer]. Asks portable concentrator. Going oot to funeral for brother (lung Ca). Hx Hep C with normal recent liver and renal function. We had made referral appointment to discuss management of UIP with Dr Chase Caller- pending. CT chest 11/19/13 IMPRESSION:  1. 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  2. Atherosclerosis, including 2 vessel coronary artery disease.  Assessment for potential  risk factor modification, dietary therapy  or pharmacologic therapy may be warranted, if clinically indicated.  Electronically Signed  By: Vinnie Langton M.D.  On: 11/19/2013 15:37    OV 01/06/2014 -  ILD CONSULT with DR [facility]  Chief Complaint  Patient presents with  . Follow-up    Increased sob and cough non-productive x1 month    Referred by Dr. Tarri Fuller young for second opinion on pulmonary fibrosis specifically with question of starting IPF specific therapy  73 year old female ex-smoker without any exposure history. According to her history she was diagnosed with pulmonary fibrosis one year ago but review of the charts shows severe restrictive lung disease with reduced diffusion capacity even as far back as December 2013 with an FVC of 1.1 L/41%. FEV1 of 0.9 L or 48%. Ratio 79. Total lung capacity of 4.1/41%. Diffusion capacity  a 5.5/28%. CT scans of the chest reported by our radiologist here consistent with a diagnosis of UIP radiologically. She denies any collagen-vascular history diagnosis. However, notably that she's had ANA which is negative. She believes that she's been on oxygen for just over a year initially starting off at 2 L but has had progressive decline in functional status with worsening dyspnea and is currently on 4 L oxygen. A year ago she was visiting [place], [place] and it sounds like she had a flare up of her interstitial lung disease and she was admitted. 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 Conversation with Dr. Annamaria Boots in she's been recommended to see me for decision of starting therapy either Pirfenidone or OFEV.  She does have a history of hepatitis C and is currently on  new protease inhibitors; HORVANI. She showed me ultrasound reported social she is mild fibrosis of the liver. This was done at [facility] liver clinic. Subsequent to  The visit: I reviewed the drug  and have learnt that this is NOT a Cyp enzyme inhibitor/promoter that can alter Esbriet/OFev levels. Additionally, HORVANI is only a 120 intent to cure Rx. I spoke to her liver specialist and she asssures me that patient does NOT have cirrhosis which would be a contra-indication for IPF specific Rx   Lab review shows a creatinine of 0.8 mg percent with GFR of 81. Normal liver function enzymes with an albumin of 3.2 on 12/03/2013     has a past medical history of Colon polyps; Hepatitis C; Diverticulosis; Diabetes mellitus; Hypertension; GERD (gastroesophageal reflux disease); Osteoporosis; and Hyperlipidemia.   has past surgical history that includes Cholecystectomy (2004); Rotator cuff repair; Abdominal hysterectomy; lipoma removal; Breast biopsy; and Hemicolectomy.    OV 02/12/2014  Chief Complaint  Patient presents with  . Follow-up    With PFT results.Pt states she has been very dyspneic with any activity and having palpitations. C/o productive cough with off-white mucous. Denies CP.   autoimmes 01/06/14 - negatifvve CT march 2015 - definitive UIP per thoracic radiology [facility] - may 2015: negative Pulmonary function test in April 2015 with an FVC of 0.5 L/24%. Total lung capacity of 1.3 the/28%. DLCO 4.3 the/19%. This is consistent with severe restrictive lung disease  Dx is severe IPF with chronic respiratory failure  No new issues since last visit.. I gave a diagnosis of severe IPF and prognosis of few years to several years max. She is very tearful. We discussed treatment options with the new drugs that and to prevent the progression of IPF. Explained uncertainty with efficacy of these drugs with severe disease and possible insurance denial with these. Also explained side effects profile of these drugs.Of note, her Hep C Rx is going real well and titers not detectable. HORVANI Rx will end in Aug 2015. There is no known contraindication to esbriet or ofev with HORVANI. Based on all  that she's not sure that these drugs would be of benefit to her. She wants to think about this. I think esbriet would be a better choice for her if she decided to go ahead with Rx due to animal experiments with potential benefit in hepatic fibrosis.  I did discuss about enhancing her quality of life to a hospice program but at this point in time she's not totally in favor of that partly because she  struggling to come to terms with it. However she is receptive to the idea.  Past and Social history: She lives alone. One child lives in [place] the other one in [place]. The daughter in [place] wants her to move to [place] but she does not want to leave. She has no family members here other than some grandkids who are young and dry keep in touch with the.    REC You have IPF disease THis is a type of Pulmonary Fibrosis This is the most common type of pulmonary fibrosis IT is a progressive disease with life expectancy of few to several years  We discussed treatment options of drugs ESBRIET and OFEV We agreed that due to benefit v side effect profile you will want to think about it We discussed hospice benefit: we agreed you will think about this  COntinue O2 for now Try hycodan cough syrup 56mL twice daily as needed  Can consider moprhine syrup at followup   Followup  1 month - 2 months to see me and discuss your thoughts on this Will update Dr Annamaria Boots - patient would like to see me at next visit to continue discussions   OV 04/30/2014  Chief Complaint  Patient presents with  . Follow-up    Pt states she has had an increase in cough, producing white mucous. Pt c/o DOE and mid sternal chest pain d/t coughing.  Pt recently d/c from [facility] for nonstemi.   FU sevee IPF with chronic respiratory failure  Feels worse with dyspnea. REcent Admission NSTEMI/cHF. Moving to [place] for few months; daughter from [place] with her and urging her to move with her permanently. Patient not sure at this stage.     Past, Family, Social reviewed: recent admission +. 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 Having perioidic leg cramps that are chronic  Review of Systems  Constitutional: Negative for fever and unexpected weight change.  HENT: Negative for congestion, dental problem, ear pain, nosebleeds, postnasal drip, rhinorrhea, sinus pressure, sneezing, sore throat and trouble swallowing.   Eyes: Negative for redness and itching.  Respiratory: Positive for cough and shortness of breath. Negative for chest tightness and wheezing.   Cardiovascular: Positive for chest pain and palpitations. Negative for leg swelling.  Gastrointestinal: Negative for nausea and vomiting.  Genitourinary: Negative for dysuria.  Musculoskeletal: Negative for joint swelling.  Skin: Negative for rash.  Neurological: Negative for headaches.  Hematological: Does not bruise/bleed easily.  Psychiatric/Behavioral: Negative for dysphoric mood. The patient is not nervous/anxious.        Objective:   Physical Exam   Filed Vitals:   04/30/14 0901  Height: 5\' 3"  (1.6 m)  Weight: 145 lb (65.772 kg)   tals reviewed. Constitutional: She is oriented to person, place, and time. She appears well-developed and well-nourished. No distress.  HENT:  Head: Normocephalic and atraumatic.  Right Ear: External ear normal.  Left Ear: External ear normal.  Mouth/Throat: Oropharynx is clear and moist. No oropharyngeal exudate.  Eyes: Conjunctivae and EOM are normal. Pupils are equal, round, and reactive to light. Right eye exhibits no discharge. Left eye exhibits no discharge. No scleral icterus.  Neck: Normal range of motion. Neck supple. No JVD present. No tracheal deviation present. No thyromegaly present.  Cardiovascular: Normal rate, regular rhythm, normal heart sounds and intact distal pulses.  Exam reveals no gallop and no friction rub.   No murmur heard. Pulmonary/Chest: . She has  no wheezes. She has rales. She exhibits no tenderness.  Crackles + . Low volume repiration with tachypnea Abdominal: Soft. Bowel sounds are normal. She exhibits no distension and no mass. There is no tenderness. There is no rebound and no guarding.  Musculoskeletal: Normal range of motion. She exhibits no edema and no tenderness.  Lymphadenopathy:    She has no cervical adenopathy.  Neurological: She is alert and oriented to person, place, and time. She has normal reflexes. No cranial nerve deficit. She exhibits normal muscle tone. Coordination normal.  Skin: Skin is warm and dry. No rash noted. She is not diaphoretic. No erythema. No pallor.  Psychiatric: She has a normal mood and affect. Her behavior is normal. Judgment and thought content normal.       Assessment & Plan:  You have IPF disease THis is a type of Pulmonary Fibrosis This is the most common type of pulmonary fibrosis IT is a progressive disease with  life expectancy of few to several years You have SEVERE type of pulmonary fibrosis that makes your life expectancy diminished  We discussed treatment options of drugs ESBRIET and OFEV These drugs are tested only in mild and moderate disease and not in your situation of severe disease IF you decide on treatment I recommend ESBRIET ( 3 pills , 3times per day) ; OFEV is caution in heart disease We agreed that due to benefit v side effect profile you will want to think about it and likely avoid these drugs We discussed hospice benefit: we agreed you will think about this. I STRONGLY RECOMMEND hospice  COntinue O2 for now; 4L - Keep pulse ox > 88% Try hycodan cough syrup 48mL twice daily as needed  Can consider moprhine syrup at followup   Followup Consider moving permanently to [place] With daughter  4 months upon return from [place]

## 2014-04-30 NOTE — Assessment & Plan Note (Signed)
Pt has severe ILD, followed by Dr Chase Caller

## 2014-04-30 NOTE — Assessment & Plan Note (Signed)
Elevated Troponin during recent hospitalization for respiratory failure

## 2014-04-30 NOTE — Patient Instructions (Addendum)
You have IPF disease THis is a type of Pulmonary Fibrosis This is the most common type of pulmonary fibrosis IT is a progressive disease with life expectancy of few to several years You have SEVERE type of pulmonary fibrosis that makes your life expectancy diminished  We discussed treatment options of drugs ESBRIET and OFEV These drugs are tested only in mild and moderate disease and not in your situation of severe disease IF you decide on treatment I recommend ESBRIET ( 3 pills , 3times per day) ; OFEV is caution in heart disease We agreed that due to benefit v side effect profile you will want to think about it and likely avoid these drugs We discussed hospice benefit: we agreed you will think about this. I STRONGLY RECOMMEND hospice  COntinue O2 for now; 4L - Keep pulse ox > 88% Try hycodan cough syrup 44mL twice daily as needed  Can consider moprhine syrup at followup   Followup Consider moving permanently to [place] With daughter  4 months upon return from [place]

## 2014-04-30 NOTE — Patient Instructions (Signed)
Your physician has recommended you make the following change in your medication:   ORDERED PRN NITRO 2  REFILLS  Your physician wants you to follow-up in: [place] will receive a reminder letter in the mail two months in advance. If you don't receive a letter, please call our office to schedule the follow-up appointment.

## 2014-04-30 NOTE — Progress Notes (Signed)
04/30/2014 Margaret Adkins   [birth year]  [id number]  Primary Physicia Crisoforo [place], PA-C Primary Cardiologist: Dr Huntley Estelle  HPI:  The patient is a 73 year old female with past medical history of diabetes, hypertension, hyperlipidemia, GERD and the significant history of pulmonary fibrosis.  She was  admitted for acute on chronic respiratory failure as well as chest pain on 04/03/14. She had an elevated Troponin (0.38)  and further workup to differentiate between demand ischemia and true NSTEMI was undertaken.                        Echocardiogram was obtained on 7/31 which showed EF of 50-55%, normal wall motion. EKG was abnormal with significant T wave inversion in the anterior leads. She underwent cardiac catheterization on 7/31 which showed her EF to be 55% with normal coronaries. CTA of the chest was obtained on the following day which showed no sign of pulmonary emboli, however does reveal probable progressive interstitial pneumonitis consistent with her history of pulmonary fibrosis. The pt has been seen as an OP by Dr Chase Caller. Her prognosis is poor. She is here today for a post hopitsal follow up. Since discharge she has done a little better. She is still significantly SOB. She is on chronic O2.      Current Outpatient Prescriptions  Medication Sig Dispense Refill  . albuterol (PROVENTIL HFA;VENTOLIN HFA) 108 (90 BASE) MCG/ACT inhaler Inhale 2 puffs into the lungs every 6 (six) hours as needed for wheezing or shortness of breath.  18 g  4  . aspirin 81 MG EC tablet Take 81 mg by mouth daily.        . budesonide-formoterol (SYMBICORT) 160-4.5 MCG/ACT inhaler 2 puffs then rinse mouth, twice daily  1 Inhaler  11  . diclofenac sodium (VOLTAREN) 1 % GEL Apply 4 g topically daily as needed (pain).      Marland Kitchen diphenhydrAMINE (BENADRYL) 25 mg capsule Take 25 mg by mouth every 6 (six) hours as needed for allergies.       . fish oil-omega-3 fatty acids 1000 MG capsule Take 2 g by mouth  daily.      Marland Kitchen HYDROcodone-homatropine (HYCODAN) 5-1.5 MG/5ML syrup Take 5 mLs by mouth 2 (two) times daily as needed for cough.  240 mL  0  . olmesartan-hydrochlorothiazide (BENICAR HCT) 40-25 MG per tablet Take 1 tablet by mouth daily.      Marland Kitchen omeprazole (PRILOSEC) 40 MG capsule Take 1 capsule (40 mg total) by mouth daily.  90 capsule  2  . pioglitazone-metformin (ACTOPLUS MET) 15-500 MG per tablet Take 1 tablet by mouth 2 (two) times daily with a meal.  180 tablet  2  . pravastatin (PRAVACHOL) 40 MG tablet Take 1 tablet (40 mg total) by mouth daily.  90 tablet  2  . traMADol (ULTRAM) 50 MG tablet Take 50 mg by mouth every 6 (six) hours as needed for moderate pain. 1 or 2 every 6 hours if needed for pain      . travoprost, benzalkonium, (TRAVATAN) 0.004 % ophthalmic solution Place 1 drop into both eyes at bedtime.        . nitroGLYCERIN (NITROSTAT) 0.4 MG SL tablet Place 1 tablet (0.4 mg total) under the tongue every 5 (five) minutes as needed for chest pain.  25 tablet  2  . [DISCONTINUED] omeprazole (PRILOSEC OTC) 20 MG tablet Take 40 mg by mouth daily.       No current facility-administered medications for  this visit.    Allergies  Allergen Reactions  . Codeine Rash    History   Social History  . Marital Status: Single    Spouse Name: N/A    Number of Children: N/A  . Years of Education: N/A   Occupational History  . Not on file.   Social History Main Topics  . Smoking status: Former Smoker -- 2.00 packs/day for 5 years    Types: Cigarettes    Quit date: 09/05/1958  . Smokeless tobacco: Never Used  . Alcohol Use: No     Comment: past use, quit in 1992  . Drug Use: No     Comment: past use quit in 1992  . Sexual Activity: Not on file   Other Topics Concern  . Not on file   Social History Narrative  . No narrative on file     Review of Systems: General: negative for chills, fever, night sweats or weight changes.  Cardiovascular: negative for edema, orthopnea,  palpitations, paroxysmal nocturnal dyspnea  Dermatological: negative for rash Respiratory: negative for cough or wheezing Urologic: negative for hematuria Abdominal: negative for nausea, vomiting, diarrhea, bright red blood per rectum, melena, or hematemesis Neurologic: negative for visual changes, syncope, or dizziness All other systems reviewed and are otherwise negative except as noted above.    Blood pressure 116/58, pulse 93, height _0  (1.626 m), weight 145 lb (65.772 kg).  General appearance: alert, cooperative and no distress Lungs: decreased breath sounds with velcro crackles Heart: regular rate and rhythm and decreased heart sounds  EKG NSR, NSST changes- previously seen TWI has resolved  ASSESSMENT AND PLAN:   Chronic respiratory failure Pt has severe ILD, followed by Dr Chase Caller  Demand ischemia of myocardium Elevated Troponin during recent hospitalization for respiratory failure  Normal coronary arteries-04/04/14 .  Type II or unspecified type diabetes mellitus without mention of complication, not stated as uncontrolled .   PLAN  The pt was seen in the office with her daughter and granddaughter. They are going to take her to [place] to see family. The pt asked for an Rx for NTG SL which I provided. I suggested she see [place] in 6 months or when she gets back from [place], but I get the impression she may end up staying there. The daughter seems to grasp the poor prognosis.   Margaret Adkins KPA-C 04/30/2014 1:07 PM

## 2014-05-03 NOTE — Assessment & Plan Note (Signed)
You have IPF disease THis is a type of Pulmonary Fibrosis This is the most common type of pulmonary fibrosis IT is a progressive disease with life expectancy of few to several years You have SEVERE type of pulmonary fibrosis that makes your life expectancy diminished  We discussed treatment options of drugs ESBRIET and OFEV These drugs are tested only in mild and moderate disease and not in your situation of severe disease IF you decide on treatment I recommend ESBRIET ( 3 pills , 3times per day) ; OFEV is caution in heart disease We agreed that due to benefit v side effect profile you will want to think about it and likely avoid these drugs We discussed hospice benefit: we agreed you will think about this. I STRONGLY RECOMMEND hospice  COntinue O2 for now; 4L - Keep pulse ox > 88% Try hycodan cough syrup 30mL twice daily as needed  Can consider moprhine syrup at followup   Followup Consider moving permanently to [place] With daughter  4 months upon return from [place]  > 50% of this > 25 min visit spent in face to face counseling (15 min visit converted to 25 min)

## 2014-05-15 ENCOUNTER — Telehealth: Payer: Self-pay | Admitting: Internal Medicine

## 2014-05-15 NOTE — Telephone Encounter (Signed)
Spoke with Lattie Haw pt neighbor. She was calling to let MR know pt was sent to the hospital last night while pt in [place]. She is at [facility]. She wants MR to call them to check on pt. I advised her can;t gaurantee this but will send message to him. Per Lattie Haw phone # [phone]. I also advised her if the hsopital had any questions they can contact out office as well Will forward to MR

## 2014-05-22 NOTE — Telephone Encounter (Signed)
ATC NA WCB 

## 2014-05-22 NOTE — Telephone Encounter (Signed)
MR please advise 

## 2014-05-22 NOTE — Telephone Encounter (Signed)
i called hospital and she is no longer a patient. Please call daughter to get an update.

## 2014-05-23 NOTE — Telephone Encounter (Signed)
Called and spoke with pts neighbor lisa and she stated that the pt has been moved to an acute rehab and the number is [phone] and she has been there since Wednesday.  She stated that the pt is in the rehab facility [facility]  [phone].  Lattie Haw stated that she is not aware of how the pt is doing at this time since she is not family.  Will send this information to MR to make him aware.

## 2014-05-26 NOTE — Telephone Encounter (Signed)
Ok thanks. Noted. Will close out

## 2014-08-14 ENCOUNTER — Encounter (HOSPITAL_COMMUNITY): Payer: Self-pay | Admitting: Cardiovascular Disease

## 2014-09-05 DEATH — deceased

## 2015-04-07 ENCOUNTER — Telehealth: Payer: Self-pay | Admitting: Family Medicine

## 2015-04-07 NOTE — Telephone Encounter (Signed)
Sympathy Card sent. 

## 2015-04-07 NOTE — Telephone Encounter (Signed)
pls send sympathy card to her family that we just learned about her passing in December.

## 2015-04-07 NOTE — Telephone Encounter (Signed)
See message below °

## 2015-04-07 NOTE — Telephone Encounter (Signed)
Per Chana Bode PA-C request I called trying to get in touch with the patient to see how she was doing since we hadn't seen her in a while and I was informed from her goddaughter that the patient has pass away in December 2015.
